# Patient Record
Sex: Female | Born: 1955 | Hispanic: No | Smoking: Never smoker
Health system: Southern US, Community
[De-identification: ages and names within clinical notes are randomized; demographics above are authoritative.]

## PROBLEM LIST (undated history)

## (undated) DIAGNOSIS — I639 Cerebral infarction, unspecified: Secondary | ICD-10-CM

## (undated) DIAGNOSIS — I10 Essential (primary) hypertension: Secondary | ICD-10-CM

## (undated) HISTORY — PX: HERNIA REPAIR: SHX51

## (undated) HISTORY — DX: Essential (primary) hypertension: I10

## (undated) HISTORY — DX: Cerebral infarction, unspecified: I63.9

---

## 1996-04-11 HISTORY — PX: OTHER SURGICAL HISTORY: SHX169

## 1999-05-19 ENCOUNTER — Other Ambulatory Visit: Admission: RE | Admit: 1999-05-19 | Discharge: 1999-05-19 | Payer: Self-pay | Admitting: Gynecology

## 2002-01-18 ENCOUNTER — Other Ambulatory Visit: Admission: RE | Admit: 2002-01-18 | Discharge: 2002-01-18 | Payer: Self-pay | Admitting: Gynecology

## 2005-06-14 ENCOUNTER — Other Ambulatory Visit: Admission: RE | Admit: 2005-06-14 | Discharge: 2005-06-14 | Payer: Self-pay | Admitting: Gynecology

## 2009-09-02 ENCOUNTER — Encounter: Admission: RE | Admit: 2009-09-02 | Discharge: 2009-09-02 | Payer: Self-pay | Admitting: Gynecology

## 2012-03-20 ENCOUNTER — Ambulatory Visit: Payer: Self-pay | Admitting: Family Medicine

## 2012-03-20 VITALS — BP 138/90 | HR 96 | Temp 98.5°F | Resp 16 | Ht 64.5 in | Wt 155.0 lb

## 2012-03-20 DIAGNOSIS — Z0289 Encounter for other administrative examinations: Secondary | ICD-10-CM

## 2012-03-20 DIAGNOSIS — R011 Cardiac murmur, unspecified: Secondary | ICD-10-CM

## 2012-03-20 NOTE — Patient Instructions (Addendum)
Your blood pressure was borderline high today.  Keep a record of your blood pressures outside of the office and bring them to the next office visit with a primary provider.  You also had a slight heart murmur today which can be followed up with a primary provider, or cardiologist in the next 6-8 weeks.  Return to the clinic or go to the nearest emergency room if any of your symptoms worsen or new symptoms occur.

## 2012-03-20 NOTE — Progress Notes (Signed)
Subjective:    Patient ID: Megan Rivera, female    DOB: 11-12-1955, 56 y.o.   MRN: [id number]  HPI Megan Rivera is a 56 y.o. 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  No primary care provider. Graduated recently form [employer]  Last td 2 years ago.    Tuberculosis Risk Questionnaire  1. Were you born outside the [place] in one of the following parts of the world:    [place], [place], [place], [place] or [place]?  no  2. Have you traveled outside the [place] and lived for more than one month in one of the following parts of the world:  [place], [place], [place], [place] or [place]?  no  3. Do you have a compromised immune system such as from any of the following conditions:  HIV/AIDS, organ or bone marrow transplantation, diabetes, immunosuppressive   medicines (e.g. Prednisone, Remicaide), leukemia, lymphoma, cancer of the   head or neck, gastrectomy or jejunal bypass, end-stage renal disease (on   dialysis), or silicosis?  No    4. Have you ever done one of the following:    Used crack cocaine, injected illegal drugs, worked or resided in jail or prison,   worked or resided at a homeless shelter, or worked as a Research scientist (physical sciences) in   direct contact with patients?  No  5. Have you ever been exposed to anyone with infectious tuberculosis?  No   Tuberculosis Symptom Questionnaire  Do you currently have any of the following symptoms?  1. Unexplained cough lasting more than 3 weeks? No  Unexplained fever lasting more than 3 weeks. No   3. Night Sweats (sweating that leaves the bedclothes and sheets wet)   No  4. Shortness of Breath No  5. Chest Pain No  6. Unintentional weight loss  No  7. Unexplained fatigue (very tired for no reason) No  Hx of migraine headache years ago.  Stressful day, no known hx of HTN.      Review of Systems  Constitutional: Negative for  fatigue and unexpected weight change.  Respiratory: Negative for chest tightness and shortness of breath.   Cardiovascular: Negative for chest pain, palpitations and leg swelling.  Gastrointestinal: Negative for abdominal pain and blood in stool.  Neurological: Negative for dizziness, syncope, light-headedness and headaches.       Objective:   Physical Exam  Constitutional: She is oriented to person, place, and time. She appears well-developed and well-nourished.  HENT:  Head: Normocephalic and atraumatic.  Right Ear: External ear normal.  Left Ear: External ear normal.  Mouth/Throat: Oropharynx is clear and moist.  Eyes: Conjunctivae normal are normal. Pupils are equal, round, and reactive to light.  Neck: Normal range of motion. Neck supple. No thyromegaly present.  Cardiovascular: Normal rate, regular rhythm and intact distal pulses.   Murmur heard.  Systolic murmur is present with a grade of 2/6       2/6 systolic murmur at sternal border.   Pulmonary/Chest: Effort normal and breath sounds normal. No respiratory distress. She has no wheezes.  Abdominal: Soft. Bowel sounds are normal. There is no tenderness.  Musculoskeletal: Normal range of motion. She exhibits no edema and no tenderness.  Lymphadenopathy:    She has no cervical adenopathy.  Neurological: She is alert and oriented to person, place, and time.  Skin: Skin is warm and dry. No rash noted.  Psychiatric: She has a normal mood  and affect. Her behavior is normal. Thought content normal.       Assessment & Plan:  Megan Rivera is a 56 y.o. female 1. Health examination of defined subpopulation   2. Heart murmur    School paperwork completed. See copy scanned. No restrictions, but discussed borderline HTN, and heart murmur needing follow up.  Asymptomatic currently. Rtc/er precautions.   Patient Instructions  Your blood pressure was borderline high today.  Keep a record of your blood pressures outside of the office  and bring them to the next office visit with a primary provider.  You also had a slight heart murmur today which can be followed up with a primary provider, or cardiologist in the next 6-8 weeks.  Return to the clinic or go to the nearest emergency room if any of your symptoms worsen or new symptoms occur.

## 2012-04-11 DIAGNOSIS — I639 Cerebral infarction, unspecified: Secondary | ICD-10-CM

## 2012-04-11 HISTORY — DX: Cerebral infarction, unspecified: I63.9

## 2012-07-12 ENCOUNTER — Emergency Department (HOSPITAL_COMMUNITY)
Admission: EM | Admit: 2012-07-12 | Discharge: 2012-07-12 | Disposition: A | Discharge status: Home / Self Care | Payer: Self-pay | Attending: Emergency Medicine | Admitting: Emergency Medicine

## 2012-07-12 ENCOUNTER — Encounter (HOSPITAL_COMMUNITY): Payer: Self-pay | Admitting: Nurse Practitioner

## 2012-07-12 ENCOUNTER — Emergency Department (HOSPITAL_COMMUNITY): Payer: Self-pay

## 2012-07-12 DIAGNOSIS — R109 Unspecified abdominal pain: Secondary | ICD-10-CM

## 2012-07-12 DIAGNOSIS — R112 Nausea with vomiting, unspecified: Secondary | ICD-10-CM | POA: Insufficient documentation

## 2012-07-12 DIAGNOSIS — Z3202 Encounter for pregnancy test, result negative: Secondary | ICD-10-CM | POA: Insufficient documentation

## 2012-07-12 DIAGNOSIS — R1013 Epigastric pain: Secondary | ICD-10-CM | POA: Insufficient documentation

## 2012-07-12 DIAGNOSIS — D649 Anemia, unspecified: Secondary | ICD-10-CM

## 2012-07-12 DIAGNOSIS — Z9889 Other specified postprocedural states: Secondary | ICD-10-CM | POA: Insufficient documentation

## 2012-07-12 DIAGNOSIS — Z8719 Personal history of other diseases of the digestive system: Secondary | ICD-10-CM | POA: Insufficient documentation

## 2012-07-12 LAB — HEPATIC FUNCTION PANEL
ALT: 17 U/L (ref 0–35)
AST: 18 U/L (ref 0–37)
Albumin: 3.5 g/dL (ref 3.5–5.2)
Total Bilirubin: 0.2 mg/dL — ABNORMAL LOW (ref 0.3–1.2)
Total Protein: 7 g/dL (ref 6.0–8.3)

## 2012-07-12 LAB — POCT I-STAT, CHEM 8
BUN: 12 mg/dL (ref 6–23)
Calcium, Ion: 1.23 mmol/L (ref 1.12–1.23)
Creatinine, Ser: 0.9 mg/dL (ref 0.50–1.10)
Glucose, Bld: 106 mg/dL — ABNORMAL HIGH (ref 70–99)
HCT: 33 % — ABNORMAL LOW (ref 36.0–46.0)
Sodium: 142 mEq/L (ref 135–145)

## 2012-07-12 LAB — CBC WITH DIFFERENTIAL/PLATELET
Basophils Absolute: 0.1 10*3/uL (ref 0.0–0.1)
Lymphs Abs: 1.4 10*3/uL (ref 0.7–4.0)
MCHC: 30 g/dL (ref 30.0–36.0)
MCV: 70.6 fL — ABNORMAL LOW (ref 78.0–100.0)
Monocytes Absolute: 0.8 10*3/uL (ref 0.1–1.0)
Monocytes Relative: 14 % — ABNORMAL HIGH (ref 3–12)
Neutro Abs: 2.9 10*3/uL (ref 1.7–7.7)
Platelets: 210 10*3/uL (ref 150–400)
RDW: 17.3 % — ABNORMAL HIGH (ref 11.5–15.5)
WBC: 5.4 10*3/uL (ref 4.0–10.5)

## 2012-07-12 LAB — URINALYSIS, ROUTINE W REFLEX MICROSCOPIC
Nitrite: NEGATIVE
Specific Gravity, Urine: 1.031 — ABNORMAL HIGH (ref 1.005–1.030)
Urobilinogen, UA: 0.2 mg/dL (ref 0.0–1.0)

## 2012-07-12 LAB — URINE MICROSCOPIC-ADD ON

## 2012-07-12 LAB — LIPASE, BLOOD: Lipase: 35 U/L (ref 11–59)

## 2012-07-12 MED ORDER — MORPHINE SULFATE 4 MG/ML IJ SOLN: 4.0000 mg | Freq: Once | INTRAMUSCULAR | Status: DC

## 2012-07-12 MED ORDER — GI COCKTAIL ~~LOC~~
30.0000 mL | Freq: Once | ORAL | Status: AC
  Administered 2012-07-12: 30 mL via ORAL
  Filled 2012-07-12: qty 30

## 2012-07-12 NOTE — ED Notes (Signed)
Per Patient:  Pt having intermittant sharp epigastric pain (under breasts that "rolls down to stomach") since Tuesday morning upon waking.  Lying propped up helps the pain.  Yesterday, pt felt nauseous and vomited x 1.  Hx of acid reflux per pt.

## 2012-07-12 NOTE — ED Notes (Signed)
Pt refused Morphine IV and IV.  She stated that she "wanted to treat this like a doctor's visit."

## 2012-07-12 NOTE — ED Notes (Signed)
Family at bedside. 

## 2012-07-12 NOTE — ED Notes (Signed)
Ultrasound at bedside for abdominal U/S.

## 2012-07-12 NOTE — ED Provider Notes (Signed)
History     CSN: [id number]  Arrival date & time 07/12/12  0716   First MD Initiated Contact with Patient 07/12/12 0720      No chief complaint on file.   (Consider location/radiation/quality/duration/timing/severity/associated sxs/prior treatment) HPI  57 year old female with history of hernia repair presents complaining of abdominal pain. Patient reports for the past 2-3 days she has had intermittent sharp epigastric pain. Pain usually lasting for a few seconds and, radiating across her abdomen, sometimes worsen when laying flat and often improve with activities. She felt nauseous and vomited 1 times. Vomitus is nonbloody, nonbilious. She did have history of acid reflux but states that this pain felt different. She has tried taking Zantac and Mylanta yesterday without any relief. She denies fever, chills, chest pain, shortness of breath, sore throat, cough, hemoptysis, hematemesis, dysuria, vaginal discharge, or rash. She is a nonsmoker with no significant family history of cardiac disease.  No past medical history on file.  Past Surgical History  Procedure Laterality Date  . Hernia repair      Family History  Problem Relation Age of Onset  . Hypertension Father   . Heart disease Father   . Cancer Maternal Aunt   . Cancer Paternal Uncle     History  Substance Use Topics  . Smoking status: Never Smoker   . Smokeless tobacco: Not on file  . Alcohol Use: Not on file    OB History   Grav Para Term Preterm Abortions TAB SAB Ect Mult Living                  Review of Systems  Constitutional:       10 Systems reviewed and all are negative for acute change except as noted in the HPI.     Allergies  Review of patient's allergies indicates no known allergies.  Home Medications  No current outpatient prescriptions on file.  There were no vitals taken for this visit.  Physical Exam  Nursing note and vitals reviewed. Constitutional: She is oriented to person, place,  and time. She appears well-developed and well-nourished. No distress.  Awake, alert, nontoxic appearance  HENT:  Head: Atraumatic.  Mouth/Throat: Oropharynx is clear and moist.  Eyes: Conjunctivae are normal. Right eye exhibits no discharge. Left eye exhibits no discharge.  Neck: Neck supple.  Cardiovascular: Normal rate and regular rhythm.  Exam reveals no gallop and no friction rub.   No murmur heard. Pulmonary/Chest: Effort normal. No respiratory distress. She exhibits no tenderness.  Abdominal: Soft. Bowel sounds are normal. She exhibits no distension. There is tenderness (Mild epigastric and mid abdomen tenderness on the patient without guarding or rebound tenderness. No hernia noted. No overlying skin changes.). There is no rebound and no guarding.  Genitourinary:  No CVA tenderness   Musculoskeletal: She exhibits no edema and no tenderness.  ROM appears intact, no obvious focal weakness  Neurological: She is alert and oriented to person, place, and time.  Mental status and motor strength appears intact  Skin: No rash noted.  Psychiatric: She has a normal mood and affect.    ED Course  Procedures (including critical care time)   Date: 07/12/2012  Rate: 71  Rhythm: normal sinus rhythm  QRS Axis: normal  Intervals: normal  ST/T Wave abnormalities: normal  Conduction Disutrbances: none  Narrative Interpretation: interpreted by me  Old EKG Reviewed: No prior ECG for comparison     7:44 AM Patient with intermittent sharp abdominal pain. Abdomen is nonsurgical. She has  a TIMI score 0, PERC negative.  Pt request for [place] to assess her gallbladder.  Will order.  Pain medication offered, pt decline.  9:29 AM Pt is currently pain free.  GI cocktail received, no changes.  Abd [place] without acute process.  Incidental finding of hyperechoic renal lesion was found, i discuss with pt the need to have repeat US in 12 months as recommended.  Pt also has evidence of anemia, Hgb 9.6.   Hemoccult negative.  Recommend pt to f/u with PCP for recheck.  Otherwise, she is stable for discharge.    Labs Reviewed  CBC WITH DIFFERENTIAL - Abnormal; Notable for the following:    Hemoglobin 9.6 (*)    HCT 32.0 (*)    MCV 70.6 (*)    MCH 21.2 (*)    RDW 17.3 (*)    Monocytes Relative 14 (*)    All other components within normal limits  URINALYSIS, ROUTINE W REFLEX MICROSCOPIC - Abnormal; Notable for the following:    APPearance CLOUDY (*)    Specific Gravity, Urine 1.031 (*)    Leukocytes, UA MODERATE (*)    All other components within normal limits  HEPATIC FUNCTION PANEL - Abnormal; Notable for the following:    Total Bilirubin 0.2 (*)    All other components within normal limits  URINE MICROSCOPIC-ADD ON - Abnormal; Notable for the following:    Squamous Epithelial / LPF MANY (*)    Bacteria, UA FEW (*)    All other components within normal limits  POCT I-STAT, CHEM 8 - Abnormal; Notable for the following:    Glucose, Bld 106 (*)    Hemoglobin 11.2 (*)    HCT 33.0 (*)    All other components within normal limits  URINE CULTURE  PREGNANCY, URINE  LIPASE, BLOOD  OCCULT BLOOD X 1 CARD TO LAB, STOOL  POCT I-STAT TROPONIN I  OCCULT BLOOD, POC DEVICE   US Abdomen Complete  07/12/2012  *RADIOLOGY REPORT*  Clinical Data:  Abdominal pain  COMPLETE ABDOMINAL ULTRASOUND  Comparison:  None.  Findings:        Gallbladder:  normal, without stone, wall thickening, or       pericholecystic fluid.  Sonographic Murphy's sign was not       elicited.              Common bile duct:  normal, at 4 mm        Liver:  normal in echogenicity without focal lesion.        IVC:  within normal limits.        Pancreas:  normal.        Spleen:  normal in size and echotexture.        Right Kidney:  11.0 cm.  No hydronephrosis. A a 1.6 cm inter/lower pole medial right renal cyst.        Left Kidney:  11.2 cm.  No hydronephrosis. A focus of apparent hyperechogenicity in the interpolar left kidney on image 7  measures 7 mm.  No shadowing.  Abdominal aorta:  Non aneurysmal without ascites.  IMPRESSION: 1. No acute process or explanation for abdominal pain. 2.  Suspicion of a 7 mm hyperechoic left renal lesion. 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  As a minority of hyperechoic renal lesions can represent solid neoplasms, follow-up with ultrasound at approximately six - 12 months is recommended.   Original Report Authenticated By: Jeronimo Greaves, M.D.      1. Abdominal pain  2. Anemia       MDM  BP 160/107  Temp(Src) 98.3 F (36.8 C) (Oral)  Resp 14  Ht 5\' 3"  (1.6 m)  SpO2 95%  LMP 06/21/2012  I have reviewed nursing notes and vital signs. I personally reviewed the imaging tests through PACS system  I reviewed available ER/hospitalization records thought the EMR         Fayrene Helper, [place] 07/12/12 4540

## 2012-07-13 LAB — URINE CULTURE

## 2012-07-13 NOTE — ED Provider Notes (Signed)
Medical screening examination/treatment/procedure(s) were performed by non-physician practitioner and as supervising physician I was immediately available for consultation/collaboration.    Celene Kras, MD 07/13/12 [phone]

## 2013-01-28 ENCOUNTER — Ambulatory Visit: Payer: Self-pay | Admitting: Advanced Practice Midwife

## 2013-02-05 ENCOUNTER — Ambulatory Visit: Payer: Self-pay | Attending: Internal Medicine

## 2013-02-14 ENCOUNTER — Ambulatory Visit: Payer: Self-pay | Admitting: Internal Medicine

## 2013-02-19 ENCOUNTER — Ambulatory Visit: Payer: Self-pay | Attending: Internal Medicine | Admitting: Internal Medicine

## 2013-02-19 VITALS — BP 171/115 | HR 80 | Temp 98.3°F | Resp 17 | Wt 168.2 lb

## 2013-02-19 DIAGNOSIS — I10 Essential (primary) hypertension: Secondary | ICD-10-CM | POA: Insufficient documentation

## 2013-02-19 DIAGNOSIS — Z0000 Encounter for general adult medical examination without abnormal findings: Secondary | ICD-10-CM | POA: Insufficient documentation

## 2013-02-19 DIAGNOSIS — I6350 Cerebral infarction due to unspecified occlusion or stenosis of unspecified cerebral artery: Secondary | ICD-10-CM

## 2013-02-19 DIAGNOSIS — Z23 Encounter for immunization: Secondary | ICD-10-CM

## 2013-02-19 DIAGNOSIS — I639 Cerebral infarction, unspecified: Secondary | ICD-10-CM | POA: Insufficient documentation

## 2013-02-19 DIAGNOSIS — K219 Gastro-esophageal reflux disease without esophagitis: Secondary | ICD-10-CM

## 2013-02-19 LAB — LIPID PANEL
HDL: 68 mg/dL (ref >39)
LDL Cholesterol: 117 mg/dL — ABNORMAL HIGH (ref 0–99)
Total CHOL/HDL Ratio: 3.2 Ratio
Triglycerides: 156 mg/dL — ABNORMAL HIGH (ref <150)
VLDL: 31 mg/dL (ref 0–40)

## 2013-02-19 LAB — BASIC METABOLIC PANEL
CO2: 25 mEq/L (ref 19–32)
Calcium: 9.4 mg/dL (ref 8.4–10.5)
Chloride: 106 mEq/L (ref 96–112)
Creat: 0.9 mg/dL (ref 0.50–1.10)
Glucose, Bld: 96 mg/dL (ref 70–99)
Potassium: 4.4 mEq/L (ref 3.5–5.3)

## 2013-02-19 LAB — CBC WITH DIFFERENTIAL/PLATELET
Basophils Absolute: 0 10*3/uL (ref 0.0–0.1)
Basophils Relative: 0 % (ref 0–1)
Eosinophils Absolute: 0.2 10*3/uL (ref 0.0–0.7)
Eosinophils Relative: 3 % (ref 0–5)
Monocytes Absolute: 0.8 10*3/uL (ref 0.1–1.0)
WBC: 5.7 10*3/uL (ref 4.0–10.5)

## 2013-02-19 MED ORDER — LISINOPRIL-HYDROCHLOROTHIAZIDE 20-25 MG PO TABS: 1.0000 | ORAL_TABLET | Freq: Every day | ORAL | Status: DC

## 2013-02-19 MED ORDER — PANTOPRAZOLE SODIUM 40 MG PO TBEC: 40.0000 mg | DELAYED_RELEASE_TABLET | Freq: Every day | ORAL | Status: DC

## 2013-02-19 NOTE — Progress Notes (Signed)
Patient here to establish care History of stroke

## 2013-02-19 NOTE — Patient Instructions (Signed)

## 2013-02-19 NOTE — Progress Notes (Signed)
Patient ID: Megan Rivera, female   DOB: 1955/04/28, 57 y.o.   MRN: [id number]  CC: New patient  HPI: 57 year old female with past medical history hypertension, CVA with no residual weakness presented to clinic to establish primary care physician. Patient feels good today with no complaints of chest pain, shortness of breath. She does have acid reflux somewhat controlled with Zantac.  No Known Allergies Past Medical History  Diagnosis Date  . Hypertension    Current Outpatient Prescriptions on File Prior to Visit  Medication Sig Dispense Refill  . ibuprofen (ADVIL,MOTRIN) 200 MG tablet Take 400 mg by mouth every 6 (six) hours as needed for pain.      . ranitidine (ZANTAC) 150 MG tablet Take 150 mg by mouth 2 (two) times daily.      . simethicone (MYLICON) 80 MG chewable tablet Chew 80 mg by mouth every 6 (six) hours as needed for flatulence.       No current facility-administered medications on file prior to visit.   Family History  Problem Relation Age of Onset  . Hypertension Father   . Heart disease Father   . Crohn's disease Father   . Cancer Maternal Aunt   . Cancer Paternal Uncle    History   Social History  . Marital Status: Unknown    Spouse Name: N/A    Number of Children: N/A  . Years of Education: N/A   Occupational History  . Not on file.   Social History Main Topics  . Smoking status: Never Smoker   . Smokeless tobacco: Never Used  . Alcohol Use: Not on file     Comment: Beer and wine on weekends  . Drug Use: No  . Sexual Activity: Yes   Other Topics Concern  . Not on file   Social History Narrative  . No narrative on file    Review of Systems  Constitutional: Negative for fever, chills, diaphoresis, activity change, appetite change and fatigue.  HENT: Negative for ear pain, nosebleeds, congestion, facial swelling, rhinorrhea, neck pain, neck stiffness and ear discharge.   Eyes: Negative for pain, discharge, redness, itching and visual disturbance.   Respiratory: Negative for cough, choking, chest tightness, shortness of breath, wheezing and stridor.   Cardiovascular: Negative for chest pain, palpitations and leg swelling.  Gastrointestinal: Negative for abdominal distention.  Genitourinary: Negative for dysuria, urgency, frequency, hematuria, flank pain, decreased urine volume, difficulty urinating and dyspareunia.  Musculoskeletal: Negative for back pain, joint swelling, arthralgias and gait problem.  Neurological: Negative for dizziness, tremors, seizures, syncope, facial asymmetry, speech difficulty, weakness, light-headedness, numbness and headaches.  Hematological: Negative for adenopathy. Does not bruise/bleed easily.  Psychiatric/Behavioral: Negative for hallucinations, behavioral problems, confusion, dysphoric mood, decreased concentration and agitation.    Objective:   Filed Vitals:   02/19/13 0941  BP: 171/115  Pulse:   Temp:   Resp:     Physical Exam  Constitutional: Appears well-developed and well-nourished. No distress.  HENT: Normocephalic. External right and left ear normal. Oropharynx is clear and moist.  Eyes: Conjunctivae and EOM are normal. PERRLA, no scleral icterus.  Neck: Normal ROM. Neck supple. No JVD. No tracheal deviation. No thyromegaly.  CVS: RRR, S1/S2 +, no murmurs, no gallops, no carotid bruit.  Pulmonary: Effort and breath sounds normal, no stridor, rhonchi, wheezes, rales.  Abdominal: Soft. BS +,  no distension, tenderness, rebound or guarding.  Musculoskeletal: Normal range of motion. No edema and no tenderness.  Lymphadenopathy: No lymphadenopathy noted, cervical, inguinal. Neuro: Alert. Normal  reflexes, muscle tone coordination. No cranial nerve deficit. Skin: Skin is warm and dry. No rash noted. Not diaphoretic. No erythema. No pallor.  Psychiatric: Normal mood and affect. Behavior, judgment, thought content normal.   Lab Results  Component Value Date   WBC 5.4 07/12/2012   HGB 11.2*  07/12/2012   HCT 33.0* 07/12/2012   MCV 70.6* 07/12/2012   PLT 210 07/12/2012   Lab Results  Component Value Date   CREATININE 0.90 07/12/2012   BUN 12 07/12/2012   NA 142 07/12/2012   K 3.9 07/12/2012   CL 107 07/12/2012    No results found for this basename: HGBA1C   Lipid Panel  No results found for this basename: chol, trig, hdl, cholhdl, vldl, ldlcalc       Assessment and plan:   Patient Active Problem List   Diagnosis Date Noted  . HTN (hypertension) 02/19/2013    Priority: High - We have discussed target BP range - I have advised pt to check BP regularly and to call us back if the numbers are higher than 140/90 - discussed the importance of compliance with medical therapy and diet  - Systolic blood pressure 171 this morning. Patient is only taking hydrochlorothiazide. We will discontinue this medication and start patient on Prinzide 20-25 mg daily. The patient was instructed to come back in one month so we can recheck blood pressure   . CVA (cerebral infarction) 02/19/2013    Priority: High - Stable with no residual weakness. Initial presentation was with left sided weakness and facial droop  - Referral to neurology provider   . Preventative health care 02/19/2013    Priority: High - GYN referral provided.  - Flu shot today  - A1c, TSH, lipid panel, BMP and CBC will be done today  - Also, we will change his Zantac to Protonix for better control of acid reflux

## 2013-02-23 ENCOUNTER — Encounter (HOSPITAL_COMMUNITY): Payer: Self-pay | Admitting: Emergency Medicine

## 2013-02-23 ENCOUNTER — Emergency Department (INDEPENDENT_AMBULATORY_CARE_PROVIDER_SITE_OTHER): Payer: Self-pay

## 2013-02-23 ENCOUNTER — Emergency Department (HOSPITAL_COMMUNITY)
Admission: EM | Admit: 2013-02-23 | Discharge: 2013-02-23 | Disposition: A | Discharge status: Home / Self Care | Payer: Self-pay | Source: Home / Self Care | Attending: Family Medicine | Admitting: Family Medicine

## 2013-02-23 DIAGNOSIS — S6990XA Unspecified injury of unspecified wrist, hand and finger(s), initial encounter: Secondary | ICD-10-CM

## 2013-02-23 DIAGNOSIS — S6992XA Unspecified injury of left wrist, hand and finger(s), initial encounter: Secondary | ICD-10-CM

## 2013-02-23 NOTE — ED Provider Notes (Signed)
CSN: [id number]     Arrival date & time 02/23/13  1800 History   First MD Initiated Contact with Patient 02/23/13 1831     Chief Complaint  Patient presents with  . Hand Injury   (Consider location/radiation/quality/duration/timing/severity/associated sxs/prior Treatment) HPI Patient is a 57 yo F presenting with left hand injury. Tripped over curb around 9:30 last night, fell on outstretched hand. Did not treat the injury last night. Woke up in pain this morning with progressively worsening swelling. Pain is worse in digits 3-4. Swelling in on dorsal surface of hand but does have a bruise on palm of hand. Denies wrist pain. No prior injury to hand before.   Past Medical History  Diagnosis Date  . Hypertension    Past Surgical History  Procedure Laterality Date  . Hernia repair    . Lump aspiration     Family History  Problem Relation Age of Onset  . Hypertension Father   . Heart disease Father   . Crohn's disease Father   . Cancer Maternal Aunt   . Cancer Paternal Uncle    History  Substance Use Topics  . Smoking status: Never Smoker   . Smokeless tobacco: Never Used  . Alcohol Use: Not on file     Comment: Beer and wine on weekends   OB History   Grav Para Term Preterm Abortions TAB SAB Ect Mult Living            1     Review of Systems  Constitutional: Negative for fever and chills.  HENT: Negative for congestion.   Eyes: Negative for visual disturbance.  Respiratory: Negative for cough and shortness of breath.   Cardiovascular: Negative for chest pain and leg swelling.  Gastrointestinal: Negative for abdominal pain.  Genitourinary: Negative for dysuria.  Musculoskeletal: Positive for arthralgias, joint swelling and myalgias.  Skin: Negative for rash and wound.  Neurological: Negative for headaches.    Allergies  Review of patient's allergies indicates no known allergies.  Home Medications   Current Outpatient Rx  Name  Route  Sig  Dispense  Refill  .  lisinopril-hydrochlorothiazide (PRINZIDE,ZESTORETIC) 20-25 MG per tablet   Oral   Take 1 tablet by mouth daily.   90 tablet   3   . pantoprazole (PROTONIX) 40 MG tablet   Oral   Take 1 tablet (40 mg total) by mouth daily.   30 tablet   3   . ibuprofen (ADVIL,MOTRIN) 200 MG tablet   Oral   Take 400 mg by mouth every 6 (six) hours as needed for pain.         . ranitidine (ZANTAC) 150 MG tablet   Oral   Take 150 mg by mouth 2 (two) times daily.         . simethicone (MYLICON) 80 MG chewable tablet   Oral   Chew 80 mg by mouth every 6 (six) hours as needed for flatulence.          BP 146/91  Pulse 100  Temp(Src) 98 F (36.7 C) (Oral)  Resp 16  SpO2 98%  LMP 06/21/2012 Physical Exam  Constitutional: She is oriented to person, place, and time. She appears well-developed and well-nourished. No distress.  HENT:  Head: Normocephalic and atraumatic.  Mouth/Throat: Oropharynx is clear and moist.  Eyes: Pupils are equal, round, and reactive to light.  Neck: Normal range of motion. Neck supple.  Cardiovascular: Normal rate, regular rhythm and normal heart sounds.   No murmur heard. Pulmonary/Chest:  Effort normal and breath sounds normal. She has no wheezes.  Abdominal: Soft. She exhibits no distension. There is no tenderness.  Musculoskeletal:       Left hand: She exhibits decreased range of motion (Decreased grip), bony tenderness (Of middle and ring fingers, most notable at MCP joints.) and swelling (Dorsal aspect of hand). She exhibits no deformity and no laceration. Normal sensation noted. Decreased strength noted. She exhibits finger abduction (Secondary to pain). She exhibits no wrist extension trouble.  Linear ecchymosis of ulnar side of palm  Neurological: She is alert and oriented to person, place, and time.  Skin: Skin is warm and dry.  Psychiatric: She has a normal mood and affect.    ED Course  Procedures (including critical care time) Labs Review Labs  Reviewed - No data to display Imaging Review Dg Wrist Complete Left  02/23/2013   CLINICAL DATA:  Fall  EXAM: LEFT WRIST - COMPLETE 3+ VIEW  COMPARISON:  None.  FINDINGS: No distal radius or ulnar fracture. Radiocarpal joint is intact. No carpal fracture. No soft tissue abnormality.  IMPRESSION: No fracture   Electronically Signed   By: Genevive Bi M.D.   On: 02/23/2013 19:32   Dg Hand Complete Left  02/23/2013   CLINICAL DATA:  Post fall, now with pain involving the 3rd and 4th digits and metacarpals  EXAM: LEFT HAND - COMPLETE 3+ VIEW  COMPARISON:  None.  FINDINGS: No fracture or dislocation with special attention paid to the 3rd and 4th metacarpals. Joint spaces are preserved. No erosions. Regional soft tissues are normal.  IMPRESSION: No acute findings.   Electronically Signed   By: Simonne Come M.D.   On: 02/23/2013 19:23    MDM   1. Hand injury, left, initial encounter    No signs of fracture. Only soft tissue injury from impact from fall. Offered buddy tape for 3rd and 4th fingers to reduce movement but patient declined.  Will use OTC pain medication. Ice, elevation and rest. F/u with PCP if pain worsens or if she has any concerns.    Hilarie Fredrickson, MD 02/23/13 2026

## 2013-02-23 NOTE — ED Notes (Signed)
C/o left hand injury, fell last night

## 2013-02-25 NOTE — ED Provider Notes (Signed)
Medical screening examination/treatment/procedure(s) were performed by a resident physician or non-physician practitioner and as the supervising physician I was immediately available for consultation/collaboration.  Burns Timson, MD    Veleka Djordjevic S Twisha Vanpelt, MD 02/25/13 0742 

## 2013-04-23 ENCOUNTER — Encounter: Payer: Self-pay | Admitting: Obstetrics & Gynecology

## 2013-04-26 ENCOUNTER — Encounter: Payer: Self-pay | Admitting: Neurology

## 2013-04-26 ENCOUNTER — Ambulatory Visit (INDEPENDENT_AMBULATORY_CARE_PROVIDER_SITE_OTHER): Payer: 59 | Admitting: Neurology

## 2013-04-26 ENCOUNTER — Encounter (INDEPENDENT_AMBULATORY_CARE_PROVIDER_SITE_OTHER): Payer: Self-pay

## 2013-04-26 ENCOUNTER — Telehealth: Payer: Self-pay | Admitting: Neurology

## 2013-04-26 VITALS — BP 135/96 | HR 97 | Ht 64.0 in | Wt 166.0 lb

## 2013-04-26 DIAGNOSIS — I619 Nontraumatic intracerebral hemorrhage, unspecified: Secondary | ICD-10-CM

## 2013-04-26 MED ORDER — AMLODIPINE BESYLATE 5 MG PO TABS: 5.0000 mg | ORAL_TABLET | Freq: Every day | ORAL | Status: DC

## 2013-04-26 NOTE — Telephone Encounter (Signed)
Patient saw Dr. Janann Colonel this morning and there are some items on her discharge instructions that she does not understand and would like a call back from the nurse. Please call.

## 2013-04-26 NOTE — Progress Notes (Signed)
[id number]    Provider:  Dr Janann Colonel Referring Provider: Robbie Lis, MD Primary Care Physician:  Kevan Ny, MD  CC:  CVA  HPI:  Megan Rivera is a 58 y.o. female here as a referral from Dr. Charlies Silvers for CVA follow up. Had a stroke in September, was on vacation in [place], was admitted to [facility], [facility], [facility]. She denies noticing any symptoms initially, eventually noted R. was told she had a left facial droop, trouble talking, gait instability with left-sided weakness. The symptoms slowly resolved over the next couple weeks. She states she was diagnosed with a hemorrhagic stroke, told her blood pressure was very high at the time of admission. Has not had repeat imaging since discharge. Has not done any physical therapy. States she is likely back to her normal baseline. Is currently taking lisinopril and hydrochlorothiazide per her primary care physician. Blood pressure hovers around 140 occasionally higher.   Has history of benign sexual headches, has happened multiple times in the past. Had a headache during the above event, initially thought it was due to the sexual headache but ended up being stroke related.   Review of Systems: Out of a complete 14 system review, the patient complains of only the following symptoms, and all other reviewed systems are negative. Positive headache insomnia anxiety  History   Social History  . Marital Status: Divorced    Spouse Name: N/A    Number of Children: 1  . Years of Education: Bachelor   Occupational History  . Not on file.   Social History Main Topics  . Smoking status: Never Smoker   . Smokeless tobacco: Never Used  . Alcohol Use: Yes     Comment: occasionally 3 drinks daily  . Drug Use: No  . Sexual Activity: Yes   Other Topics Concern  . Not on file   Social History Narrative   Patient lives at home with with boyfriend, has 1 child   Patient is right handed   Education level is Bachelor   Caffeine  consumption is 1 cup daily    Family History  Problem Relation Age of Onset  . Hypertension Father   . Heart disease Father   . Crohn's disease Father   . Cancer Maternal Aunt   . Cancer Paternal Uncle     Past Medical History  Diagnosis Date  . Hypertension     Past Surgical History  Procedure Laterality Date  . Hernia repair    . Lump aspiration      Current Outpatient Prescriptions  Medication Sig Dispense Refill  . ibuprofen (ADVIL,MOTRIN) 200 MG tablet Take 400 mg by mouth every 6 (six) hours as needed for pain.      Marland Kitchen lisinopril-hydrochlorothiazide (PRINZIDE,ZESTORETIC) 20-25 MG per tablet Take 1 tablet by mouth daily.  90 tablet  3  . pantoprazole (PROTONIX) 40 MG tablet Take 1 tablet (40 mg total) by mouth daily.  30 tablet  3   No current facility-administered medications for this visit.    Allergies as of 04/26/2013  . (No Known Allergies)    Vitals: BP 135/96  Pulse 97  Ht 5\' 4"  (1.626 m)  Wt 166 lb (75.297 kg)  BMI 28.48 kg/m2  LMP 06/21/2012 Last Weight:  Wt Readings from Last 1 Encounters:  04/26/13 166 lb (75.297 kg)   Last Height:   Ht Readings from Last 1 Encounters:  04/26/13 5\' 4"  (1.626 m)     Physical exam: Exam: Gen: NAD, conversant Eyes:  anicteric sclerae, moist conjunctivae HENT: Atraumatic, oropharynx clear Neck: Trachea midline; supple,  Lungs: CTA, no wheezing, rales, rhonic                          CV: RRR, no MRG Abdomen: Soft, non-tender;  Extremities: No peripheral edema  Skin: Normal temperature, no rash,  Psych: Appropriate affect, pleasant  Neuro: MS: AA&Ox3, appropriately interactive, normal affect   Speech: fluent w/o paraphasic error  Memory: good recent and remote recall  CN: PERRL, EOMI no nystagmus, no ptosis, sensation intact to LT V1-V3 bilat, face symmetric, no weakness, hearing grossly intact, palate elevates symmetrically, shoulder shrug 5/5 bilat,  tongue protrudes midline, no fasiculations  noted.  Motor: normal bulk and tone Strength: 5/5  In all extremities with minimal decreased ROM in distal LUE  Coord: rapid alternating and point-to-point (FNF, HTS) movements intact.  Reflexes: brisk left sided reflex, equivocal L plantar response, right downgoing  Sens: LT intact in all extremities  Gait: posture, stance, stride and arm-swing normal. Tandem gait intact. Able to walk on heels and toes. Romberg absent.   Assessment:  After physical and neurologic examination, review of laboratory studies, imaging, neurophysiology testing and pre-existing records, assessment will be reviewed on the problem list.  Plan:  Treatment plan and additional workup will be reviewed under Problem List.  1)Hemorrhagic infarct: suspect hypertensive etiology 2)HTN 3)Headache  58 year old woman with history of stroke in September 2014, which was hemorrhagic per the patient, presenting for initial evaluation after discharge. [employer] patient was on vacation in [place], told at that time her blood pressure was markedly elevated. Based on history suspect is like a hemorrhagic infarct. There has been no repeat imaging since discharge. Will repeat head CT. Will continue patient on hydrochlorothiazide and lisinopril, will add Norvasc 5 mg daily. If head CT is unremarkable would consider addition of daily aspirin. Patient counseled to followup in 6 months. Will obtain records from outside hospital.   Jim Like, DO  [facility] [address] [place], [place] [address]  Phone [phone] Fax [phone]

## 2013-04-26 NOTE — Telephone Encounter (Signed)
Spoke to patient and she just wanted to know if Dr. Janann Colonel was the one who scheduled her with Dr. Hulan Fray. I explained to her that that appointment was already scheduled prior to [place] and she understands.

## 2013-04-26 NOTE — Patient Instructions (Signed)
Overall you are doing fairly well but I do want to suggest a few things today:   Remember to drink plenty of fluid, eat healthy meals and do not skip any meals. Try to eat protein with a every meal and eat a healthy snack such as fruit or nuts in between meals. Try to keep a regular sleep-wake schedule and try to exercise daily, particularly in the form of walking, 20-30 minutes a day, if you can.   As far as your medications are concerned, I would like to start you on a medication called Norvasc. Start 5mg  daily.   As far as diagnostic testing I would like to check a follow up head CT. You will be called to schedule this  I would like to see you back in 6 months, sooner if we need to. Please call us with any interim questions, concerns, problems, updates or refill requests.   Please also call us for any test results so we can go over those with you on the phone.  My clinical assistant and will answer any of your questions and relay your messages to me and also relay most of my messages to you.   Our phone number is [phone]. We also have an after hours call service for urgent matters and there is a physician on-call for urgent questions. For any emergencies you know to call 911 or go to the nearest emergency room

## 2013-05-01 ENCOUNTER — Other Ambulatory Visit: Payer: Self-pay

## 2013-05-01 ENCOUNTER — Telehealth: Payer: Self-pay | Admitting: Neurology

## 2013-05-01 NOTE — Telephone Encounter (Signed)
[facility] called to let the doctor know that the patient was a no show today for her CT scan.

## 2013-05-02 ENCOUNTER — Ambulatory Visit
Admission: RE | Admit: 2013-05-02 | Discharge: 2013-05-02 | Disposition: A | Discharge status: Home / Self Care | Payer: 59 | Source: Ambulatory Visit | Attending: Neurology | Admitting: Neurology

## 2013-05-02 DIAGNOSIS — I619 Nontraumatic intracerebral hemorrhage, unspecified: Secondary | ICD-10-CM

## 2013-05-29 ENCOUNTER — Encounter: Payer: Self-pay | Admitting: Obstetrics & Gynecology

## 2013-06-04 ENCOUNTER — Ambulatory Visit: Payer: 59 | Attending: Internal Medicine

## 2013-06-04 ENCOUNTER — Telehealth: Payer: Self-pay | Admitting: Internal Medicine

## 2013-06-04 MED ORDER — ACYCLOVIR 5 % EX OINT: 1.0000 "application " | TOPICAL_OINTMENT | CUTANEOUS | Status: DC

## 2013-06-04 MED ORDER — ACYCLOVIR 400 MG PO TABS: 400.0000 mg | ORAL_TABLET | Freq: Every day | ORAL | Status: DC

## 2013-06-04 NOTE — Telephone Encounter (Signed)
Pt. Present to [facility] for a prescription for zovirax for cold sores.

## 2013-06-04 NOTE — Progress Notes (Unsigned)
Pt here with c/o cold sore to lower bottom lip that appeared this morning. Pt not using otc cream States zovirax oint works better vss

## 2013-07-16 ENCOUNTER — Ambulatory Visit (INDEPENDENT_AMBULATORY_CARE_PROVIDER_SITE_OTHER): Payer: 59 | Admitting: Advanced Practice Midwife

## 2013-07-16 ENCOUNTER — Encounter: Payer: Self-pay | Admitting: Advanced Practice Midwife

## 2013-07-16 VITALS — BP 135/88 | HR 82 | Temp 98.5°F | Ht 64.0 in | Wt 176.0 lb

## 2013-07-16 DIAGNOSIS — Z3202 Encounter for pregnancy test, result negative: Secondary | ICD-10-CM

## 2013-07-16 DIAGNOSIS — B009 Herpesviral infection, unspecified: Secondary | ICD-10-CM

## 2013-07-16 DIAGNOSIS — Z01419 Encounter for gynecological examination (general) (routine) without abnormal findings: Secondary | ICD-10-CM

## 2013-07-16 DIAGNOSIS — Z113 Encounter for screening for infections with a predominantly sexual mode of transmission: Secondary | ICD-10-CM

## 2013-07-16 LAB — POCT URINE PREGNANCY: PREG TEST UR: NEGATIVE

## 2013-07-16 MED ORDER — ACYCLOVIR 400 MG PO TABS: 400.0000 mg | ORAL_TABLET | Freq: Two times a day (BID) | ORAL | Status: DC

## 2013-07-16 NOTE — Progress Notes (Addendum)
Subjective:     Megan Rivera is a 58 y.o. female here for a routine exam.  Current complaints: Patient is in the office today for an Annual Exam. Patient states she thought she was post menopausal because she did not have any bleeding for a year until last month when she had 2 cycles in one month.     Patient very happy today, her daughter was accepted to a [place]. W/ full scholarship.  She is doing well. She had a bleeding stroke 12/2013 and is seeing a neurologist for this. She has not est care w/ a PCP.  Gynecologic History Patient's last menstrual period was 07/03/2012. Contraception: none Last Pap: 2010. Results were: normal Last mammogram: 2010. Results were: normal  Obstetric History OB History  Gravida Para Term Preterm AB SAB TAB Ectopic Multiple Living  1 1 1       1     # Outcome Date GA Lbr Len/2nd Weight Sex Delivery Anes PTL Lv  1 TRM 05/05/95 [redacted]w[redacted]d  7 lb 1 oz (3.204 kg) F SVD EPI  Y     The following portions of the patient's history were reviewed and updated as appropriate: allergies, current medications, past family history, past medical history, past social history, past surgical history and problem list.  Past Medical History  Diagnosis Date  . Hypertension   . Stroke    Family History  Problem Relation Age of Onset  . Hypertension Father   . Heart disease Father   . Crohn's disease Father   . Cancer Maternal Aunt   . Cancer Paternal Uncle      Review of Systems A comprehensive review of systems was negative except for: Genitourinary: positive for abnormal menstrual periods  Patient denies bloating, abdominal pain.    Objective:    BP 135/88  Pulse 82  Temp(Src) 98.5 F (36.9 C)  Ht 5\' 4"  (1.626 m)  Wt 176 lb (79.833 kg)  BMI 30.20 kg/m2  LMP 07/03/2012  General Appearance:    Alert, cooperative, no distress, appears stated age  Head:    Normocephalic, without obvious abnormality, atraumatic  Eyes:    PERRL, conjunctiva/corneas clear, EOM's  intact, fundi    benign, both eyes  Ears:    Normal TM's and external ear canals, both ears  Nose:   Nares normal, septum midline, mucosa normal, no drainage    or sinus tenderness  Throat:   Lips, mucosa, and tongue normal; teeth and gums normal  Neck:   Supple, symmetrical, trachea midline, no adenopathy;    thyroid:  no enlargement/tenderness/nodules; no carotid   bruit or JVD  Back:     Symmetric, no curvature, ROM normal, no CVA tenderness  Lungs:     Clear to auscultation bilaterally, respirations unlabored  Chest Wall:    No tenderness or deformity   Heart:    Regular rate and rhythm, S1 and S2 normal, no murmur, rub   or gallop  Breast Exam:    No tenderness, masses, or nipple abnormality  Abdomen:     Soft, non-tender, bowel sounds active all four quadrants,    no masses, no organomegaly  Genitalia:    Normal female without lesion, discharge or tenderness  Rectal:    Normal tone, normal prostate, no masses or tenderness;   guaiac negative stool  Extremities:   Extremities normal, atraumatic, no cyanosis or edema  Pulses:   2+ and symmetric all extremities  Skin:   Skin color, texture, turgor normal, no rashes or  lesions  Lymph nodes:   Cervical, supraclavicular, and axillary nodes normal  Neurologic:   CNII-XII intact, normal strength, sensation and reflexes    throughout       Assessment:    Healthy female exam.  Oral HSV History of recent Stroke, under care of neurologist Perimenopause Uterine bleeding recently 1 year after amenorrhea (06/2012) most likely attributed to recent stroke and medication.   Plan:    Education reviewed: calcium supplements, low fat, low cholesterol diet, safe sex/STD prevention, self breast exams and weight bearing exercise. Contraception: none. Mammogram ordered. Follow up in: 1 year. Mammogram ordered   Encouraged establishing care w/ PCP. Patient desires zovrax cream for HSV, this is not covered under her insurance, will take  Acyclovir; Rx sent to pharmacy. Cont to monitor uterine bleeding, patient to notify us if there is a change in status or symptoms change. MD Jodi Mourning agrees w/ plan of care. TSH, STD panel, Pap, GC/CT done today.  40 min spent with patient greater than 80% spent in counseling and coordination of care.   Jasaiah Karwowski Roni Bread CNM

## 2013-07-17 LAB — WET PREP BY MOLECULAR PROBE
Candida species: NEGATIVE
Gardnerella vaginalis: POSITIVE — AB
Trichomonas vaginosis: NEGATIVE

## 2013-07-17 LAB — GC/CHLAMYDIA PROBE AMP
CT PROBE, AMP APTIMA: NEGATIVE
GC Probe RNA: NEGATIVE

## 2013-07-17 LAB — TSH: TSH: 1.157 u[IU]/mL (ref 0.350–4.500)

## 2013-07-17 LAB — RPR

## 2013-07-17 LAB — HIV ANTIBODY (ROUTINE TESTING W REFLEX): HIV: NONREACTIVE

## 2013-07-17 LAB — HEPATITIS B SURFACE ANTIGEN: Hepatitis B Surface Ag: NEGATIVE

## 2013-07-17 LAB — HEPATITIS C ANTIBODY: HCV Ab: NEGATIVE

## 2013-07-18 LAB — PAP IG W/ RFLX HPV ASCU

## 2013-07-19 ENCOUNTER — Other Ambulatory Visit: Payer: Self-pay | Admitting: Advanced Practice Midwife

## 2013-07-19 ENCOUNTER — Ambulatory Visit (HOSPITAL_COMMUNITY)
Admission: RE | Admit: 2013-07-19 | Discharge: 2013-07-19 | Disposition: A | Discharge status: Home / Self Care | Payer: 59 | Source: Ambulatory Visit | Attending: Advanced Practice Midwife | Admitting: Advanced Practice Midwife

## 2013-07-19 DIAGNOSIS — Z1231 Encounter for screening mammogram for malignant neoplasm of breast: Secondary | ICD-10-CM

## 2013-07-19 DIAGNOSIS — Z0000 Encounter for general adult medical examination without abnormal findings: Secondary | ICD-10-CM

## 2013-07-31 ENCOUNTER — Other Ambulatory Visit: Payer: Self-pay | Admitting: Internal Medicine

## 2013-07-31 ENCOUNTER — Other Ambulatory Visit: Payer: Self-pay | Admitting: *Deleted

## 2013-07-31 MED ORDER — OMEPRAZOLE 20 MG PO CPDR: 20.0000 mg | DELAYED_RELEASE_CAPSULE | Freq: Every day | ORAL | Status: DC

## 2013-08-08 ENCOUNTER — Other Ambulatory Visit: Payer: Self-pay | Admitting: *Deleted

## 2013-08-08 DIAGNOSIS — N760 Acute vaginitis: Principal | ICD-10-CM

## 2013-08-08 DIAGNOSIS — B9689 Other specified bacterial agents as the cause of diseases classified elsewhere: Secondary | ICD-10-CM

## 2013-08-08 MED ORDER — METRONIDAZOLE 500 MG PO TABS: 500.0000 mg | ORAL_TABLET | Freq: Two times a day (BID) | ORAL | Status: DC

## 2013-10-19 ENCOUNTER — Other Ambulatory Visit: Payer: Self-pay | Admitting: Internal Medicine

## 2013-10-19 DIAGNOSIS — K219 Gastro-esophageal reflux disease without esophagitis: Secondary | ICD-10-CM

## 2013-11-04 ENCOUNTER — Ambulatory Visit: Payer: 59 | Admitting: Neurology

## 2013-12-02 ENCOUNTER — Ambulatory Visit: Payer: 59 | Admitting: Neurology

## 2014-01-04 ENCOUNTER — Other Ambulatory Visit: Payer: Self-pay | Admitting: Internal Medicine

## 2014-02-02 ENCOUNTER — Other Ambulatory Visit: Payer: Self-pay | Admitting: Internal Medicine

## 2014-02-10 ENCOUNTER — Encounter: Payer: Self-pay | Admitting: Advanced Practice Midwife

## 2014-02-16 ENCOUNTER — Emergency Department (HOSPITAL_COMMUNITY)
Admission: EM | Admit: 2014-02-16 | Discharge: 2014-02-16 | Disposition: A | Discharge status: Home / Self Care | Payer: 59 | Attending: Emergency Medicine | Admitting: Emergency Medicine

## 2014-02-16 ENCOUNTER — Encounter (HOSPITAL_COMMUNITY): Payer: Self-pay | Admitting: Emergency Medicine

## 2014-02-16 DIAGNOSIS — M79609 Pain in unspecified limb: Secondary | ICD-10-CM

## 2014-02-16 DIAGNOSIS — Z792 Long term (current) use of antibiotics: Secondary | ICD-10-CM | POA: Diagnosis not present

## 2014-02-16 DIAGNOSIS — Z79899 Other long term (current) drug therapy: Secondary | ICD-10-CM | POA: Diagnosis not present

## 2014-02-16 DIAGNOSIS — M79662 Pain in left lower leg: Secondary | ICD-10-CM | POA: Diagnosis not present

## 2014-02-16 DIAGNOSIS — Z8673 Personal history of transient ischemic attack (TIA), and cerebral infarction without residual deficits: Secondary | ICD-10-CM | POA: Diagnosis not present

## 2014-02-16 DIAGNOSIS — I10 Essential (primary) hypertension: Secondary | ICD-10-CM | POA: Insufficient documentation

## 2014-02-16 NOTE — Discharge Instructions (Signed)
Read the information below.  You may return to the Emergency Department at any time for worsening condition or any new symptoms that concern you.  If you develop uncontrolled pain, weakness or numbness of the extremity, severe discoloration of the skin, or you are unable to walk, return to the ER for a recheck.      Musculoskeletal Pain Musculoskeletal pain is muscle and boney aches and pains. These pains can occur in any part of the body. Your caregiver may treat you without knowing the cause of the pain. They may treat you if blood or urine tests, X-rays, and other tests were normal.  CAUSES There is often not a definite cause or reason for these pains. These pains may be caused by a type of germ (virus). The discomfort may also come from overuse. Overuse includes working out too hard when your body is not fit. Boney aches also come from weather changes. Bone is sensitive to atmospheric pressure changes. HOME CARE INSTRUCTIONS   Ask when your test results will be ready. Make sure you get your test results.  Only take over-the-counter or prescription medicines for pain, discomfort, or fever as directed by your caregiver. If you were given medications for your condition, do not drive, operate machinery or power tools, or sign legal documents for 24 hours. Do not drink alcohol. Do not take sleeping pills or other medications that may interfere with treatment.  Continue all activities unless the activities cause more pain. When the pain lessens, slowly resume normal activities. Gradually increase the intensity and duration of the activities or exercise.  During periods of severe pain, bed rest may be helpful. Lay or sit in any position that is comfortable.  Putting ice on the injured area.  Put ice in a bag.  Place a towel between your skin and the bag.  Leave the ice on for 15 to 20 minutes, 3 to 4 times a day.  Follow up with your caregiver for continued problems and no reason can be found  for the pain. If the pain becomes worse or does not go away, it may be necessary to repeat tests or do additional testing. Your caregiver may need to look further for a possible cause. SEEK IMMEDIATE MEDICAL CARE IF:  You have pain that is getting worse and is not relieved by medications.  You develop chest pain that is associated with shortness or breath, sweating, feeling sick to your stomach (nauseous), or throw up (vomit).  Your pain becomes localized to the abdomen.  You develop any new symptoms that seem different or that concern you. MAKE SURE YOU:   Understand these instructions.  Will watch your condition.  Will get help right away if you are not doing well or get worse. Document Released: 03/28/2005 Document Revised: 06/20/2011 Document Reviewed: 11/30/2012 [facility] Patient Information 2015 [place], [place]. This information is not intended to replace advice given to you by your health care provider. Make sure you discuss any questions you have with your health care provider.

## 2014-02-16 NOTE — Progress Notes (Signed)
VASCULAR LAB PRELIMINARY  PRELIMINARY  PRELIMINARY  PRELIMINARY  Left lower extremity venous duplex completed.    Preliminary report:  Left:  No evidence of DVT, superficial thrombosis, or Baker's cyst.  [name], RVT 02/16/2014, 1:08 PM

## 2014-02-16 NOTE — ED Provider Notes (Signed)
CSN: [id number]     Arrival date & time 02/16/14  1152 History   First MD Initiated Contact with Patient 02/16/14 1159     Chief Complaint  Patient presents with  . Leg Pain     (Consider location/radiation/quality/duration/timing/severity/associated sxs/prior Treatment) The history is provided by the patient.     Pt with hx HTN, stroke, presents with left calf pain and left posterior thigh pain.  States she developed a 10/10 intensity cramp in her left calf 3 days ago overnight that improved but has had residual 2/10 dull ache that has remained constant.  Last night pain began to radiate into her left posterior thigh.  She is on abx for dental abscess but otherwise has been feeling well.  Denies fevers, CP, SOB.  Denies injury to this leg.  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  No exogenous estrogen.  +Family hx blood clots.   Past Medical History  Diagnosis Date  . Hypertension   . Stroke    Past Surgical History  Procedure Laterality Date  . Hernia repair    . Lump aspiration     Family History  Problem Relation Age of Onset  . Hypertension Father   . Heart disease Father   . Crohn's disease Father   . Cancer Maternal Aunt   . Cancer Paternal Uncle    History  Substance Use Topics  . Smoking status: Never Smoker   . Smokeless tobacco: Never Used  . Alcohol Use: Yes     Comment: occasionally 3 drinks daily   OB History    Gravida Para Term Preterm AB TAB SAB Ectopic Multiple Living   1 1 1       1      Review of Systems  All other systems reviewed and are negative.     Allergies  Review of patient's allergies indicates no known allergies.  Home Medications   Prior to Admission medications   Medication Sig Start Date End Date Taking? Authorizing Provider  acyclovir (ZOVIRAX) 400 MG tablet Take 1 tablet (400 mg total) by mouth 5 (five) times daily. 06/04/13   Tresa Garter, MD  acyclovir (ZOVIRAX) 400 MG tablet Take 1 tablet (400  mg total) by mouth 2 (two) times daily. 07/16/13   Amy Thereasa Parkin, CNM  amLODipine (NORVASC) 5 MG tablet Take 1 tablet (5 mg total) by mouth daily. 04/26/13   Hulen Luster, DO  ibuprofen (ADVIL,MOTRIN) 200 MG tablet Take 400 mg by mouth every 6 (six) hours as needed for pain.    Historical Provider, MD  lisinopril-hydrochlorothiazide (PRINZIDE,ZESTORETIC) 20-25 MG per tablet Take 1 tablet by mouth daily. 02/19/13   Robbie Lis, MD  metroNIDAZOLE (FLAGYL) 500 MG tablet Take 1 tablet (500 mg total) by mouth 2 (two) times daily. 08/08/13   Amy Thereasa Parkin, CNM  omeprazole (PRILOSEC) 20 MG capsule TAKE ONE CAPSULE EVERY DAY 02/06/14   Tresa Garter, MD   BP 136/99 mmHg  Pulse 83  Temp(Src) 97.9 F (36.6 C) (Oral)  Resp 18  Ht 5\' 4"  (1.626 m)  Wt 150 lb (68.04 kg)  BMI 25.73 kg/m2  SpO2 99% Physical Exam  Constitutional: She appears well-developed and well-nourished. No distress.  HENT:  Head: Normocephalic and atraumatic.  Neck: Neck supple.  Cardiovascular: Normal rate, regular rhythm and intact distal pulses.   Pulmonary/Chest: Effort normal and breath sounds normal. No respiratory distress. She has no wheezes. She has no rales.  Musculoskeletal: Normal range of  motion. She exhibits no edema.  LLE:  Left calf tender.  No focal tenderness.  No masses.  No erythema, edema, warmth.  Sensation intact.  Distal pulses intact.  Foot is warm and well perfused. Full AROM.    Neurological: She is alert.  Skin: She is not diaphoretic.  Nursing note and vitals reviewed.   ED Course  Procedures (including critical care time) Labs Review Labs Reviewed - No data to display  Imaging Review No results found.   EKG Interpretation None       Venous duplex negative for blood clot.   1:36 PM Pt states she is so relieved she does not have a blood clot, states she does not want treatment for the pain.  She will use heat/ice, will drink extra water and take potassium at home.    MDM    Final diagnoses:  Calf pain, left    Afebrile, nontoxic patient with left calf pain x 4 days.  Started as spasm and now dull ache 1-2/10 intensity.  Family hx blood clots and pt works sedentary job.  No other known risk factors for blood clot.  No injury.  Neurovascularly intact. Venous duplex negative.   D/C home with PCP followup.  Discussed result, findings, treatment, and follow up  with patient.  Pt given return precautions.  Pt verbalizes understanding and agrees with plan.        Clayton Bibles, PA-C 02/16/14 [place], MD 02/21/14 [phone]

## 2014-02-16 NOTE — ED Notes (Signed)
Pt reports Thursday with severe pain in left calf. Pt reports pain now radiating up to left thigh. Pt feels a knot in her left calf. Pt with family history of blood clots. Pt history of HTN and stroke.

## 2014-03-04 ENCOUNTER — Other Ambulatory Visit: Payer: Self-pay | Admitting: Neurology

## 2014-03-04 NOTE — Telephone Encounter (Signed)
Patient di not come in for last 2 scheduled appts

## 2014-04-01 ENCOUNTER — Other Ambulatory Visit: Payer: Self-pay | Admitting: Neurology

## 2014-04-02 NOTE — Telephone Encounter (Signed)
Patient no showed last appt and cancelled the appt prior to that.  I spoke with her and she will call the office to reschedule

## 2014-04-03 NOTE — Telephone Encounter (Signed)
Auth temp supply via [facility] since Dr Janann Colonel left the practice so patient is not without medication over the holiday weekend.

## 2014-04-27 ENCOUNTER — Other Ambulatory Visit: Payer: Self-pay | Admitting: Internal Medicine

## 2014-06-05 ENCOUNTER — Other Ambulatory Visit: Payer: Self-pay | Admitting: Neurology

## 2014-06-10 ENCOUNTER — Other Ambulatory Visit: Payer: Self-pay | Admitting: Neurology

## 2014-06-10 NOTE — Telephone Encounter (Signed)
Duplicate request

## 2014-06-12 ENCOUNTER — Institutional Professional Consult (permissible substitution): Payer: 59 | Admitting: Neurology

## 2014-06-18 ENCOUNTER — Other Ambulatory Visit: Payer: Self-pay | Admitting: Neurology

## 2014-07-05 ENCOUNTER — Other Ambulatory Visit: Payer: Self-pay | Admitting: Internal Medicine

## 2014-07-13 ENCOUNTER — Other Ambulatory Visit: Payer: Self-pay | Admitting: Neurology

## 2014-07-22 ENCOUNTER — Telehealth: Payer: Self-pay | Admitting: Neurology

## 2014-07-22 NOTE — Telephone Encounter (Signed)
Pt is calling requesting a refill on amLODipine (NORVASC) 5 MG tablet she states she is completely out. Please call and advise.

## 2014-07-22 NOTE — Telephone Encounter (Signed)
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  Has not been seen in over 1 year.  Last appt from 03/03 says patient left without being seen.  I called the patient back.  Says she will check her calendar and call us back to schedule appt.

## 2014-07-24 MED ORDER — AMLODIPINE BESYLATE 5 MG PO TABS: ORAL_TABLET | ORAL | Status: DC

## 2014-07-24 NOTE — Telephone Encounter (Signed)
Rx was previously prescribed by Dr Janann Colonel.  Refill sent to last until appt.  I called back to advise. Got no answer.  Left message.

## 2014-07-24 NOTE — Telephone Encounter (Signed)
Patient has appt scheduled with Dr. Krista Blue 08/14/14 and questioning if she could get Rx amLODipine (NORVASC) 5 MG tablet refilled too last until appointment date.  Please call and advise.

## 2014-08-14 ENCOUNTER — Ambulatory Visit: Payer: 59 | Admitting: Neurology

## 2014-09-16 ENCOUNTER — Ambulatory Visit (INDEPENDENT_AMBULATORY_CARE_PROVIDER_SITE_OTHER): Payer: 59 | Admitting: Internal Medicine

## 2014-09-16 VITALS — BP 156/90 | HR 66 | Temp 98.5°F | Resp 16 | Ht 64.4 in | Wt 169.5 lb

## 2014-09-16 DIAGNOSIS — R011 Cardiac murmur, unspecified: Secondary | ICD-10-CM

## 2014-09-16 DIAGNOSIS — R010 Benign and innocent cardiac murmurs: Secondary | ICD-10-CM | POA: Diagnosis not present

## 2014-09-16 DIAGNOSIS — I619 Nontraumatic intracerebral hemorrhage, unspecified: Secondary | ICD-10-CM

## 2014-09-16 DIAGNOSIS — I10 Essential (primary) hypertension: Secondary | ICD-10-CM

## 2014-09-16 MED ORDER — AMLODIPINE BESYLATE 5 MG PO TABS: ORAL_TABLET | ORAL | Status: DC

## 2014-09-16 NOTE — Progress Notes (Addendum)
Subjective:    Patient ID: Megan Rivera, female    DOB: 1955/09/03, 59 y.o.   MRN: [id number] This chart was scribed for Tami Lin, MD by Marti Sleigh, Medical Scribe. This patient was seen in Room 12 and the patient's care was started a 2:36 PM.  Chief Complaint  Patient presents with  . Medication Refill    all of her BP medications  . needs referral to neurology    been over 1 yr since she has been seen.  will need to have referral placed    HPI HPI Comments: Megan Rivera is a 59 y.o. female with a past hx of hemorraghic CVA (two years ago) who presents to [facility] reporting for a medication refill. Pt states her BP is high today because she ran out of her BP medication yesterday. Pt states she was told her CVA was due to undetected high blood pressure.  Pt is also complaining of her toes intermittently want to curl on the left side, which is the same side that she had her CVA. Pt states she was cleared by the neurologist after her CVA two years ago. Pt states she has not had her cholesterol checked in multiple years.   She needs full evaluation for health maintenance issues except mammogram and Pap smear She was not happy with her last physician--06/04/2013--would like to follow-up here  Review of Systems  Constitutional: Negative for fever and chills.  Respiratory: Negative for shortness of breath.   Cardiovascular: Negative for chest pain and palpitations.  Neurological: Negative for dizziness and headaches.       Objective:   Physical Exam  Constitutional: She is oriented to person, place, and time. She appears well-developed and well-nourished. No distress.  HENT:  Head: Normocephalic and atraumatic.  Eyes: Conjunctivae and EOM are normal. Pupils are equal, round, and reactive to light.  Neck: Neck supple. No thyromegaly present.  Cardiovascular: Normal rate, regular rhythm and intact distal pulses.   Murmur heard. 2/6 systolic ejection murmur along the left  sternal border without radiation to the carotids  Pulmonary/Chest: Effort normal and breath sounds normal. No respiratory distress.  Musculoskeletal: Normal range of motion. She exhibits no edema.  Lymphadenopathy:    She has no cervical adenopathy.  Neurological: She is alert and oriented to person, place, and time. She has normal reflexes. No cranial nerve deficit. She exhibits normal muscle tone. Coordination normal.  Skin: Skin is warm and dry. She is not diaphoretic.  Psychiatric: She has a normal mood and affect. Her behavior is normal.  Nursing note and vitals reviewed.  BP 156/90 mmHg  Pulse 66  Temp(Src) 98.5 F (36.9 C) (Oral)  Resp 16  Ht 5' 4.4" (1.636 m)  Wt 169 lb 8 oz (76.885 kg)  BMI 28.73 kg/m2  SpO2 97%     Assessment & Plan:  Essential hypertension--off medications/erratic follow-up  Flow murmur--- she has a history of this for many years although it is never been investigated by echo  Nontraumatic intracerebral hemorrhage, unspecified cerebral location--no sequelae   She wanted to defer blood work until she follows up for her health maintenance exam in 3 months She agrees to defer neurology follow-ups and she has a normal exam and this will be rechecked at her follow-up  Meds ordered this encounter  Medications  . amLODipine (NORVASC) 5 MG tablet    Sig: TAKE 1 TABLET (5 MG TOTAL) BY MOUTH DAILY.    Dispense:  90 tablet    Refill:  0   She will check home blood pressures and call if not below 130/80 in the next 3 weeks  I have completed the patient encounter in its entirety as documented by the scribe, with editing by me where necessary. Courtnay Petrilla P. Laney Pastor, M.D.

## 2014-09-17 ENCOUNTER — Other Ambulatory Visit: Payer: Self-pay | Admitting: Internal Medicine

## 2014-09-17 ENCOUNTER — Other Ambulatory Visit: Payer: Self-pay | Admitting: Neurology

## 2014-09-17 NOTE — Progress Notes (Signed)
LMOM to give [place] a call back about an appt with any provider she needs a follow-up for CPE before she runs out of her 3 month supply of medication with anyone who is taking new patients at [facility]

## 2014-10-07 ENCOUNTER — Other Ambulatory Visit: Payer: Self-pay | Admitting: Internal Medicine

## 2014-10-20 ENCOUNTER — Encounter: Payer: 59 | Admitting: Physician Assistant

## 2014-10-21 ENCOUNTER — Encounter: Payer: Self-pay | Admitting: Gastroenterology

## 2014-10-21 ENCOUNTER — Ambulatory Visit (INDEPENDENT_AMBULATORY_CARE_PROVIDER_SITE_OTHER): Payer: 59 | Admitting: Family Medicine

## 2014-10-21 VITALS — BP 128/88 | HR 72 | Temp 98.8°F | Resp 16 | Ht 64.5 in | Wt 168.0 lb

## 2014-10-21 DIAGNOSIS — I639 Cerebral infarction, unspecified: Secondary | ICD-10-CM | POA: Diagnosis not present

## 2014-10-21 DIAGNOSIS — Z1211 Encounter for screening for malignant neoplasm of colon: Secondary | ICD-10-CM

## 2014-10-21 DIAGNOSIS — Z1231 Encounter for screening mammogram for malignant neoplasm of breast: Secondary | ICD-10-CM | POA: Diagnosis not present

## 2014-10-21 DIAGNOSIS — Z0000 Encounter for general adult medical examination without abnormal findings: Secondary | ICD-10-CM

## 2014-10-21 DIAGNOSIS — K219 Gastro-esophageal reflux disease without esophagitis: Secondary | ICD-10-CM

## 2014-10-21 DIAGNOSIS — I10 Essential (primary) hypertension: Secondary | ICD-10-CM

## 2014-10-21 MED ORDER — OMEPRAZOLE 10 MG PO CPDR: 10.0000 mg | DELAYED_RELEASE_CAPSULE | Freq: Every day | ORAL | Status: DC

## 2014-10-21 NOTE — Progress Notes (Signed)
Subjective:    Patient ID: Megan Rivera, female    DOB: [birth year], 57 y.o.   MRN: [id number]  HPI The patient presents today for CPE. She has a history of HTN, CVA. LMP 3/15. 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   Last CPE- 12/14 Mammo- 07/2013 Pap- 4/15 Colonoscopy- interested in screening Tdap- less than 10 years (approx 3 years ago) Flu- last year Eye- regular Dental-regular Exercise- physical job  Has noticed that she has daily curling of her toes on her left foot and has noticed some weakness with her left arm.  Would like referral to neuro for post CVA care.   Review of Systems No chest pain, no SOB, no palpitation, no visual changes, no headaches, no dizziness,no diarrhea, no constipation, no abdominal pain, some low back soreness occasionally. Sleeps well, no anxiety or depression.     Objective:   Physical Exam Physical Exam  Constitutional: She is oriented to person, place, and time. She appears well-developed and well-nourished. No distress.  HENT:  Head: Normocephalic and atraumatic.  Right Ear: External ear normal.  Left Ear: External ear normal.  Nose: Nose normal.  Mouth/Throat: Oropharynx is clear and moist. No oropharyngeal exudate.  Eyes: Conjunctivae are normal. Pupils are equal, round, and reactive to light.  Neck: Normal range of motion. Neck supple. No JVD present. No thyromegaly present.  Cardiovascular: Normal rate, regular rhythm, normal heart sounds and intact distal pulses.   Pulmonary/Chest: Effort normal and breath sounds normal. Right breast exhibits no inverted nipple, no mass, no nipple discharge, no skin change and no tenderness. Left breast exhibits no inverted nipple, no mass, no nipple discharge, no skin change and no tenderness. Breasts are symmetrical.  Abdominal: Soft. Bowel sounds are normal. She exhibits no distension and no mass. There is no tenderness. There is no rebound and no guarding.  Genitourinary: Vagina  normal. Pelvic exam was performed with patient supine. There is no rash, tenderness, lesion or injury on the right labia. There is no rash, tenderness, lesion or injury on the left labia. No vaginal discharge found.  Musculoskeletal: Normal range of motion. She exhibits no edema or tenderness. Strength 5/5 throughout. DTRs +2. Lymphadenopathy:    She has no cervical adenopathy.  Neurological: She is alert and oriented to person, place, and time. She has normal reflexes.  Skin: Skin is warm and dry. She is not diaphoretic.  Psychiatric: She has a normal mood and affect. Her behavior is normal. Judgment and thought content normal.  Vitals reviewed.  BP 128/88 mmHg  Pulse 72  Temp(Src) 98.8 F (37.1 C) (Oral)  Resp 16  Ht 5' 4.5" (1.638 m)  Wt 168 lb (76.204 kg)  BMI 28.40 kg/m2  SpO2 97% Wt Readings from Last 3 Encounters:  10/21/14 168 lb (76.204 kg)  09/16/14 169 lb 8 oz (76.885 kg)  02/16/14 150 lb (68.04 kg)      Assessment & Plan:  1. Annual physical exam  2. Essential hypertension - CBC - Comprehensive metabolic panel - Lipid panel - TSH  3. Gastroesophageal reflux disease, esophagitis presence not specified - omeprazole (PRILOSEC) 10 MG capsule; Take 1 capsule (10 mg total) by mouth daily.  Dispense: 30 capsule; Refill: 2 - patient has been on PPI for about 2 years. Discussed weaning off PPI and provided instructions.  4. Cerebral infarction due to unspecified mechanism - Ambulatory referral to Neurology  5. Colon cancer screening - Ambulatory referral to Gastroenterology  6. Encounter for screening mammogram  for breast cancer - MM Digital Screening; Future  - follow up in 3 months Clarene Reamer, FNP-BC  [facility] [employer]  10/21/2014 3:42 PM

## 2014-10-21 NOTE — Patient Instructions (Signed)
You are currently on a low dose of omeprazole. You can try to wean off by taking every other day. On alternating days, you can take ranitidine or famotidine over the counter. After 1-2 weeks, you can take every three days for 2 weeks then stop.  Gastroesophageal Reflux Disease, Adult Gastroesophageal reflux disease (GERD) happens when acid from your stomach flows up into the esophagus. When acid comes in contact with the esophagus, the acid causes soreness (inflammation) in the esophagus. Over time, GERD may create small holes (ulcers) in the lining of the esophagus. CAUSES   Increased body weight. This puts pressure on the stomach, making acid rise from the stomach into the esophagus.  Smoking. This increases acid production in the stomach.  Drinking alcohol. This causes decreased pressure in the lower esophageal sphincter (valve or ring of muscle between the esophagus and stomach), allowing acid from the stomach into the esophagus.  Late evening meals and a full stomach. This increases pressure and acid production in the stomach.  A malformed lower esophageal sphincter. Sometimes, no cause is found. SYMPTOMS   Burning pain in the lower part of the mid-chest behind the breastbone and in the mid-stomach area. This may occur twice a week or more often.  Trouble swallowing.  Sore throat.  Dry cough.  Asthma-like symptoms including chest tightness, shortness of breath, or wheezing. DIAGNOSIS  Your caregiver may be able to diagnose GERD based on your symptoms. In some cases, X-rays and other tests may be done to check for complications or to check the condition of your stomach and esophagus. TREATMENT  Your caregiver may recommend over-the-counter or prescription medicines to help decrease acid production. Ask your caregiver before starting or adding any new medicines.  HOME CARE INSTRUCTIONS   Change the factors that you can control. Ask your caregiver for guidance concerning weight  loss, quitting smoking, and alcohol consumption.  Avoid foods and drinks that make your symptoms worse, such as:  Caffeine or alcoholic drinks.  Chocolate.  Peppermint or mint flavorings.  Garlic and onions.  Spicy foods.  Citrus fruits, such as oranges, lemons, or limes.  Tomato-based foods such as sauce, chili, salsa, and pizza.  Fried and fatty foods.  Avoid lying down for the 3 hours prior to your bedtime or prior to taking a nap.  Eat small, frequent meals instead of large meals.  Wear loose-fitting clothing. Do not wear anything tight around your waist that causes pressure on your stomach.  Raise the head of your bed 6 to 8 inches with wood blocks to help you sleep. Extra pillows will not help.  Only take over-the-counter or prescription medicines for pain, discomfort, or fever as directed by your caregiver.  Do not take aspirin, ibuprofen, or other nonsteroidal anti-inflammatory drugs (NSAIDs). SEEK IMMEDIATE MEDICAL CARE IF:   You have pain in your arms, neck, jaw, teeth, or back.  Your pain increases or changes in intensity or duration.  You develop nausea, vomiting, or sweating (diaphoresis).  You develop shortness of breath, or you faint.  Your vomit is green, yellow, black, or looks like coffee grounds or blood.  Your stool is red, bloody, or black. These symptoms could be signs of other problems, such as heart disease, gastric bleeding, or esophageal bleeding. MAKE SURE YOU:   Understand these instructions.  Will watch your condition.  Will get help right away if you are not doing well or get worse. Document Released: 01/05/2005 Document Revised: 06/20/2011 Document Reviewed: 10/15/2010 ExitCare Patient Information 2015  [employer]. This information is not intended to replace advice given to you by your health care provider. Make sure you discuss any questions you have with your health care provider.

## 2014-10-22 ENCOUNTER — Encounter: Payer: Self-pay | Admitting: Family Medicine

## 2014-10-22 LAB — COMPREHENSIVE METABOLIC PANEL
ALBUMIN: 4 g/dL (ref 3.5–5.2)
ALK PHOS: 89 U/L (ref 39–117)
ALT: 24 U/L (ref 0–35)
AST: 18 U/L (ref 0–37)
BUN: 13 mg/dL (ref 6–23)
CHLORIDE: 104 meq/L (ref 96–112)
CO2: 27 meq/L (ref 19–32)
Calcium: 9.7 mg/dL (ref 8.4–10.5)
Creat: 0.77 mg/dL (ref 0.50–1.10)
GLUCOSE: 96 mg/dL (ref 70–99)
Potassium: 3.9 mEq/L (ref 3.5–5.3)
Sodium: 141 mEq/L (ref 135–145)
Total Bilirubin: 0.5 mg/dL (ref 0.2–1.2)
Total Protein: 7 g/dL (ref 6.0–8.3)

## 2014-10-22 LAB — LIPID PANEL
CHOL/HDL RATIO: 3.8 ratio
CHOLESTEROL: 226 mg/dL — AB (ref 0–200)
HDL: 60 mg/dL (ref >=46)
LDL CALC: 137 mg/dL — AB (ref 0–99)
Triglycerides: 144 mg/dL (ref <150)
VLDL: 29 mg/dL (ref 0–40)

## 2014-10-22 LAB — CBC
HEMATOCRIT: 43.3 % (ref 36.0–46.0)
HEMOGLOBIN: 14.5 g/dL (ref 12.0–15.0)
MCH: 29.3 pg (ref 26.0–34.0)
MCHC: 33.5 g/dL (ref 30.0–36.0)
MCV: 87.5 fL (ref 78.0–100.0)
MPV: 11.3 fL (ref 8.6–12.4)
PLATELETS: 194 10*3/uL (ref 150–400)
RBC: 4.95 MIL/uL (ref 3.87–5.11)
RDW: 15.8 % — AB (ref 11.5–15.5)
WBC: 6.5 10*3/uL (ref 4.0–10.5)

## 2014-10-22 LAB — TSH: TSH: 1.345 u[IU]/mL (ref 0.350–4.500)

## 2014-10-24 ENCOUNTER — Other Ambulatory Visit: Payer: Self-pay

## 2014-10-24 DIAGNOSIS — Z1231 Encounter for screening mammogram for malignant neoplasm of breast: Secondary | ICD-10-CM

## 2014-11-04 ENCOUNTER — Ambulatory Visit
Admission: RE | Admit: 2014-11-04 | Discharge: 2014-11-04 | Disposition: A | Discharge status: Home / Self Care | Payer: 59 | Source: Ambulatory Visit | Attending: Family Medicine | Admitting: Family Medicine

## 2014-11-04 ENCOUNTER — Ambulatory Visit: Payer: 59

## 2014-11-04 DIAGNOSIS — Z1231 Encounter for screening mammogram for malignant neoplasm of breast: Secondary | ICD-10-CM

## 2014-12-23 ENCOUNTER — Other Ambulatory Visit: Payer: Self-pay | Admitting: Internal Medicine

## 2014-12-25 ENCOUNTER — Encounter: Payer: Self-pay | Admitting: Gastroenterology

## 2014-12-30 ENCOUNTER — Encounter: Payer: 59 | Admitting: Gastroenterology

## 2015-02-04 ENCOUNTER — Ambulatory Visit (AMBULATORY_SURGERY_CENTER): Payer: Self-pay

## 2015-02-04 VITALS — Ht 64.0 in | Wt 168.6 lb

## 2015-02-04 DIAGNOSIS — Z800 Family history of malignant neoplasm of digestive organs: Secondary | ICD-10-CM

## 2015-02-04 MED ORDER — SUPREP BOWEL PREP KIT 17.5-3.13-1.6 GM/177ML PO SOLN: 1.0000 | Freq: Once | ORAL | Status: DC

## 2015-02-04 NOTE — Progress Notes (Signed)
No allergies to eggs or soy No past problems with anesthesia No diet/weight loss meds No home oxygen  Has internet; refused emmi

## 2015-02-17 ENCOUNTER — Encounter: Payer: Self-pay | Admitting: Gastroenterology

## 2015-02-17 ENCOUNTER — Ambulatory Visit (AMBULATORY_SURGERY_CENTER): Payer: 59 | Admitting: Gastroenterology

## 2015-02-17 VITALS — BP 140/90 | HR 57 | Temp 97.7°F | Resp 16 | Ht 64.0 in | Wt 168.0 lb

## 2015-02-17 DIAGNOSIS — D123 Benign neoplasm of transverse colon: Secondary | ICD-10-CM | POA: Diagnosis not present

## 2015-02-17 DIAGNOSIS — Z1211 Encounter for screening for malignant neoplasm of colon: Secondary | ICD-10-CM

## 2015-02-17 DIAGNOSIS — Z800 Family history of malignant neoplasm of digestive organs: Secondary | ICD-10-CM

## 2015-02-17 MED ORDER — SODIUM CHLORIDE 0.9 % IV SOLN: 500.0000 mL | INTRAVENOUS | Status: DC

## 2015-02-17 NOTE — Progress Notes (Signed)
A/ox3 pleased with MAC, report to Sheila RN 

## 2015-02-17 NOTE — Progress Notes (Signed)
Called to room to assist during endoscopic procedure.  Patient ID and intended procedure confirmed with present staff. Received instructions for my participation in the procedure from the performing physician.  

## 2015-02-17 NOTE — Patient Instructions (Signed)
YOU HAD AN ENDOSCOPIC PROCEDURE TODAY AT THE [facility]:   Refer to the procedure report that was given to you for any specific questions about what was found during the examination.  If the procedure report does not answer your questions, please call your gastroenterologist to clarify.  If you requested that your care partner not be given the details of your procedure findings, then the procedure report has been included in a sealed envelope for you to review at your convenience later.  YOU SHOULD EXPECT: Some feelings of bloating in the abdomen. Passage of more gas than usual.  Walking can help get rid of the air that was put into your GI tract during the procedure and reduce the bloating. If you had a lower endoscopy (such as a colonoscopy or flexible sigmoidoscopy) you may notice spotting of blood in your stool or on the toilet paper. If you underwent a bowel prep for your procedure, you may not have a normal bowel movement for a few days.  Please Note:  You might notice some irritation and congestion in your nose or some drainage.  This is from the oxygen used during your procedure.  There is no need for concern and it should clear up in a day or so.  SYMPTOMS TO REPORT IMMEDIATELY:   Following lower endoscopy (colonoscopy or flexible sigmoidoscopy):  Excessive amounts of blood in the stool  Significant tenderness or worsening of abdominal pains  Swelling of the abdomen that is new, acute  Fever of 100F or higher   For urgent or emergent issues, a gastroenterologist can be reached at any hour by calling [phone].   DIET: Your first meal following the procedure should be a small meal and then it is ok to progress to your normal diet. Heavy or fried foods are harder to digest and may make you feel nauseous or bloated.  Likewise, meals heavy in dairy and vegetables can increase bloating.  Drink plenty of fluids but you should avoid alcoholic beverages for 24  hours.  ACTIVITY:  You should plan to take it easy for the rest of today and you should NOT DRIVE or use heavy machinery until tomorrow (because of the sedation medicines used during the test).    FOLLOW UP: Our staff will call the number listed on your records the next business day following your procedure to check on you and address any questions or concerns that you may have regarding the information given to you following your procedure. If we do not reach you, we will leave a message.  However, if you are feeling well and you are not experiencing any problems, there is no need to return our call.  We will assume that you have returned to your regular daily activities without incident.  If any biopsies were taken you will be contacted by phone or by letter within the next 1-3 weeks.  Please call us at [phone] if you have not heard about the biopsies in 3 weeks.    SIGNATURES/CONFIDENTIALITY: You and/or your care partner have signed paperwork which will be entered into your electronic medical record.  These signatures attest to the fact that that the information above on your After Visit Summary has been reviewed and is understood.  Full responsibility of the confidentiality of this discharge information lies with you and/or your care-partner.    Resume medications. Information given on polyps. 

## 2015-02-17 NOTE — Op Note (Signed)
[place]  [address] [place], [id number]   COLONOSCOPY PROCEDURE REPORT  PATIENT: Infant, Doane  MR#: [id number] BIRTHDATE: 1955/05/21 , 57  yrs. old GENDER: female ENDOSCOPIST: Milus Banister, MD REFERRED GL:OVFIEP Laney Pastor, M.D. PROCEDURE DATE:  02/17/2015 PROCEDURE:   Colonoscopy, screening and Colonoscopy with biopsy First Screening Colonoscopy - Avg.  risk and is 50 yrs.  old or older Yes.  Prior Negative Screening - Now for repeat screening. N/A  History of Adenoma - Now for follow-up colonoscopy & has been > or = to 3 yrs.  N/A  Polyps removed today? Yes ASA CLASS:   Class II INDICATIONS:Screening for colonic neoplasia and Colorectal Neoplasm Risk Assessment for this procedure is average risk. MEDICATIONS: Monitored anesthesia care and Propofol 250 mg IV  DESCRIPTION OF PROCEDURE:   After the risks benefits and alternatives of the procedure were thoroughly explained, informed consent was obtained.  The digital rectal exam revealed no abnormalities of the rectum.   The LB CF-[id number] Loaner [id number] endoscope was introduced through the anus and advanced to the cecum, which was identified by both the appendix and ileocecal valve. No adverse events experienced.   The quality of the prep was excellent.  The instrument was then slowly withdrawn as the colon was fully examined. Estimated blood loss is zero unless otherwise noted in this procedure report.   COLON FINDINGS: A sessile polyp measuring 2 mm in size was found in the transverse colon.  A polypectomy was performed with cold forceps.  The resection was complete, the polyp tissue was completely retrieved and sent to histology.   The examination was otherwise normal.  Retroflexed views revealed no abnormalities. The time to cecum = 2.7 Withdrawal time = 10.8   The scope was withdrawn and the procedure completed. COMPLICATIONS: There were no immediate complications.  ENDOSCOPIC IMPRESSION: 1.    Sessile polyp was found in the transverse colon; polypectomy was performed with cold forceps 2.   The examination was otherwise normal  RECOMMENDATIONS: If the polyp(s) removed today are proven to be adenomatous (pre-cancerous) polyps, you will need a repeat colonoscopy in 5 years.  Otherwise you should continue to follow colorectal cancer screening guidelines for "routine risk" patients with colonoscopy in 10 years.  You will receive a letter within 1-2 weeks with the results of your biopsy as well as final recommendations.  Please call my office if you have not received a letter after 3 weeks.  eSigned:  Milus Banister, MD 02/17/2015 2:33 PM

## 2015-02-18 ENCOUNTER — Telehealth: Payer: Self-pay | Admitting: *Deleted

## 2015-02-18 NOTE — Telephone Encounter (Signed)
Left message that we called for f/u 

## 2015-02-25 ENCOUNTER — Encounter: Payer: Self-pay | Admitting: Gastroenterology

## 2015-03-09 ENCOUNTER — Encounter: Payer: Self-pay | Admitting: Internal Medicine

## 2015-05-07 ENCOUNTER — Other Ambulatory Visit: Payer: Self-pay | Admitting: Family Medicine

## 2015-06-09 ENCOUNTER — Ambulatory Visit: Payer: Self-pay | Admitting: Family Medicine

## 2015-06-12 ENCOUNTER — Other Ambulatory Visit: Payer: Self-pay | Admitting: Internal Medicine

## 2015-06-12 NOTE — Telephone Encounter (Signed)
Needs office visit.

## 2015-10-14 ENCOUNTER — Ambulatory Visit (INDEPENDENT_AMBULATORY_CARE_PROVIDER_SITE_OTHER): Payer: BLUE CROSS/BLUE SHIELD | Admitting: Emergency Medicine

## 2015-10-14 VITALS — BP 142/84 | HR 71 | Temp 98.3°F | Resp 16 | Ht 64.0 in | Wt 174.0 lb

## 2015-10-14 DIAGNOSIS — I10 Essential (primary) hypertension: Secondary | ICD-10-CM | POA: Diagnosis not present

## 2015-10-14 DIAGNOSIS — K219 Gastro-esophageal reflux disease without esophagitis: Secondary | ICD-10-CM

## 2015-10-14 DIAGNOSIS — I639 Cerebral infarction, unspecified: Secondary | ICD-10-CM | POA: Diagnosis not present

## 2015-10-14 MED ORDER — AMLODIPINE BESYLATE 5 MG PO TABS: 5.0000 mg | ORAL_TABLET | Freq: Every day | ORAL | Status: DC

## 2015-10-14 MED ORDER — RANITIDINE HCL 300 MG PO TABS: 300.0000 mg | ORAL_TABLET | ORAL | Status: DC

## 2015-10-14 NOTE — Progress Notes (Addendum)
By signing my name below, I, Raven Small, attest that this documentation has been prepared under the direction and in the presence of Arlyss Queen, MD.  Electronically Signed: Thea Alken, ED Scribe. 10/14/2015. 12:14 PM.  Chief Complaint:  Chief Complaint  Patient presents with  . Medication Problem    patient would like to try Amlodipine or something else for blood pressure     HPI: Megan Rivera is a 60 y.o. female who reports to [facility] today for a medication change. Pt states she had initially been on Amlodipine but requested a medication change after experiencing skin change while on this medication. Initial medication change was made by Dr. Nancy Fetter.  Pt would now like to switch back to Amlodipine. States Metoprolol causes extreme fatigue and found that skin changes were never caused by Amlodipine. .  Pt takes Zantac once a day but still has break through reflux symptoms and has to take Tums.     Pt is UTD on colonoscopy.   Pt reports hx of hemorrhagic stroke in 2014. She had 1 follow up visit with neuro but none after that. Pt states lately her SO has noticed her Gait has been off and also reports her "toes have been curling". Pt is not a smoker.   CT head 05/02/13  Abnormal CT head (without) demonstrating: 1. Right basal ganglia and right frontal subcortical region of hypodensity (2.4x1.0cm). May represent cystic encephalomalacia from prior intracerebral hemorrhage. 2. No acute findings.  Past Medical History  Diagnosis Date  . Hypertension   . Stroke [facility]) 2014   Past Surgical History  Procedure Laterality Date  . Hernia repair      as a small child  . Lump aspiration  1998   Social History   Social History  . Marital Status: Divorced    Spouse Name: N/A  . Number of Children: 1  . Years of Education: Bachelor   Social History Main Topics  . Smoking status: Never Smoker   . Smokeless tobacco: Never Used  . Alcohol Use: 4.2 oz/week    7 Glasses of wine per week   Comment: occasionally 3 drinks daily  . Drug Use: No  . Sexual Activity:    Partners: Male    Birth Control/ Protection: None   Other Topics Concern  . None   Social History Narrative   Patient lives at home with with boyfriend, has 1 child   Patient is right handed   Education level is Bachelor   Caffeine consumption is 1 cup daily   Family History  Problem Relation Age of Onset  . Hypertension Father   . Heart disease Father   . Crohn's disease Father   . Rectal cancer Father   . Cancer Maternal Aunt   . Cancer Paternal Uncle    No Known Allergies Prior to Admission medications   Medication Sig Start Date End Date Taking? Authorizing Provider  metoprolol succinate (TOPROL-XL) 25 MG 24 hr tablet Take 25 mg by mouth daily.   Yes Historical Provider, MD     ROS: The patient denies fevers, chills, night sweats, unintentional weight loss, chest pain, palpitations, wheezing, dyspnea on exertion, nausea, vomiting, abdominal pain, dysuria, hematuria, melena, numbness, weakness, or tingling.   All other systems have been reviewed and were otherwise negative with the exception of those mentioned in the HPI and as above.    PHYSICAL EXAM: Filed Vitals:   10/14/15 1127  BP: 142/84  Pulse: 71  Temp: 98.3 F (36.8 C)  Resp: 16  Recheck:  Left arm-162/100 Right arm -164/104   Body mass index is 29.85 kg/(m^2).   General: Alert, no acute distress HEENT:  Normocephalic, atraumatic, oropharynx patent. Eye: Juliette Mangle [facility] Cardiovascular:  Regular rate and rhythm, no rubs murmurs or gallops.  No Carotid bruits, radial pulse intact. No pedal edema.  Respiratory: Clear to auscultation bilaterally.  No wheezes, rales, or rhonchi.  No cyanosis, no use of accessory musculature Abdominal: No organomegaly, abdomen is soft and non-tender, positive bowel sounds.  No masses. Musculoskeletal: Gait intact. No edema, tenderness Skin: No rashes. Neurologic: Facial musculature symmetric.  Brisk reflexes but no focal neurological signs.  Psychiatric: Patient acts appropriately throughout our interaction. Lymphatic: No cervical or submandibular lymphadenopathy   ASSESSMENT/PLAN: Patient has a history of previous stroke. She has not had follow-up in over a year. She is referred for CT of the head as well as follow-up with neurology. She feels weak and fatigued on beta blockers. She is placed back on amlodipine. She will keep a register of her blood pressure and follow-up in 1-2 weeks. At that time will consider scheduling a sleep study.I personally performed the services described in this documentation, which was scribed in my presence. The recorded information has been reviewed and is accurate. She also requested medications to take for reflux. She had been on Prilosec in the past but weaned off of this and experienced extreme heartburn. She is now starting to experience reflux again and would like to try medication for. I prescribed Zantac 300 milligrams to take at night.   Gross sideeffects, risk and benefits, and alternatives of medications d/w patient. Patient is aware that all medications have potential sideeffects and we are unable to predict every sideeffect or drug-drug interaction that may occur.  Arlyss Queen MD 10/14/2015 12:14 PM

## 2015-10-14 NOTE — Patient Instructions (Addendum)
I have changed her blood pressure medicine to amlodipine. I will schedule you for a follow-up CT of your head. I have made a referral for you to see the neurologist.    IF you received an x-ray today, you will receive an invoice from [facility]. Please contact [facility] at [phone] with questions or concerns regarding your invoice.   IF you received labwork today, you will receive an invoice from [employer]. Please contact [facility] at [phone] with questions or concerns regarding your invoice.   Our billing staff will not be able to assist you with questions regarding bills from these companies.  You will be contacted with the lab results as soon as they are available. The fastest way to get your results is to activate your My Chart account. Instructions are located on the last page of this paperwork. If you have not heard from [place] regarding the results in 2 weeks, please contact this office.     We recommend that you schedule a mammogram for breast cancer screening. Typically, you do not need a referral to do this. Please contact a local imaging center to schedule your mammogram.  [facility] - [phone]  *ask for the Radiology Department [facility] - [phone] or [phone]  [facility] - [phone] [place] [phone] [facility] - [phone]  *ask for the [facility] - [phone]  *ask for the Radiology Department [facility] - [phone]  *ask for the [place] - [phone]

## 2015-10-22 ENCOUNTER — Ambulatory Visit
Admission: RE | Admit: 2015-10-22 | Discharge: 2015-10-22 | Disposition: A | Discharge status: Home / Self Care | Payer: BLUE CROSS/BLUE SHIELD | Source: Ambulatory Visit | Attending: Emergency Medicine | Admitting: Emergency Medicine

## 2015-10-22 DIAGNOSIS — I639 Cerebral infarction, unspecified: Secondary | ICD-10-CM

## 2016-07-29 ENCOUNTER — Ambulatory Visit: Payer: BLUE CROSS/BLUE SHIELD

## 2016-10-09 ENCOUNTER — Other Ambulatory Visit: Payer: Self-pay | Admitting: Emergency Medicine

## 2016-10-10 NOTE — Telephone Encounter (Signed)
Please call patient. Rx authorized. Needs to schedule follow-up and establish with new PCP since Dr. Laney Pastor has retired.  Meds ordered this encounter  Medications  . amLODipine (NORVASC) 5 MG tablet    Sig: TAKE 1 TABLET BY MOUTH DAILY    Dispense:  90 tablet    Refill:  0    Please notify patient that s/he needs an office visit +/- labsfor additional refills.

## 2016-10-11 ENCOUNTER — Telehealth: Payer: Self-pay | Admitting: Family Medicine

## 2016-10-11 NOTE — Telephone Encounter (Signed)
SPOKE WITH PT SHE STATES THAT SHE WILL CALL BACK FOR ESTABLISH CARE WITH A NEW PROVIDER IF SHE FEEL LIKE SHE NEW A REFILL ON MEDICINES

## 2016-11-17 ENCOUNTER — Other Ambulatory Visit: Payer: Self-pay | Admitting: Emergency Medicine

## 2016-12-20 ENCOUNTER — Ambulatory Visit: Payer: BLUE CROSS/BLUE SHIELD | Admitting: Physician Assistant

## 2016-12-21 ENCOUNTER — Ambulatory Visit (INDEPENDENT_AMBULATORY_CARE_PROVIDER_SITE_OTHER): Payer: BLUE CROSS/BLUE SHIELD | Admitting: Physician Assistant

## 2016-12-21 ENCOUNTER — Encounter: Payer: Self-pay | Admitting: Physician Assistant

## 2016-12-21 VITALS — BP 132/94 | HR 73 | Temp 97.8°F | Resp 18 | Ht 64.0 in | Wt 175.8 lb

## 2016-12-21 DIAGNOSIS — I10 Essential (primary) hypertension: Secondary | ICD-10-CM

## 2016-12-21 DIAGNOSIS — K219 Gastro-esophageal reflux disease without esophagitis: Secondary | ICD-10-CM

## 2016-12-21 DIAGNOSIS — L03818 Cellulitis of other sites: Secondary | ICD-10-CM

## 2016-12-21 DIAGNOSIS — Z23 Encounter for immunization: Secondary | ICD-10-CM | POA: Diagnosis not present

## 2016-12-21 MED ORDER — AMLODIPINE BESYLATE 5 MG PO TABS: 5.0000 mg | ORAL_TABLET | Freq: Every day | ORAL | 1 refills | Status: DC

## 2016-12-21 MED ORDER — DOXYCYCLINE HYCLATE 100 MG PO CAPS: 100.0000 mg | ORAL_CAPSULE | Freq: Two times a day (BID) | ORAL | 0 refills | Status: AC

## 2016-12-21 MED ORDER — RANITIDINE HCL 300 MG PO TABS: 300.0000 mg | ORAL_TABLET | ORAL | 1 refills | Status: DC

## 2016-12-21 NOTE — Patient Instructions (Addendum)
In terms of elevated blood pressure, I would like you to check your blood pressure at least a couple times over the next week outside of the office and document these values. It is best if you check the blood pressure at different times in the day. Your goal is <140/90. If your values are consistently above this goal, please increase amlodipine dose to 10mg  daily. Otherwise, if it is <140/90, keep taking the amlodipine 5mg . If you start to have chest pain, blurred vision, shortness of breath, severe headache, lower leg swelling, or nausea/vomiting please seek care immediately here or at the ED.   In terms of cellulitis, take antibiotic as prescribed. Use sunscreen while on this medication as it can increase risk of sunburn while on it. I also recommend applying a warm compress to affected area for 4-5 x a day. If your symptoms worsen or this does not fully resolve, please return for further evaluation.    IF you received an x-ray today, you will receive an invoice from [facility]. Please contact [facility] at [phone] with questions or concerns regarding your invoice.   IF you received labwork today, you will receive an invoice from [place]. Please contact [facility] at [phone] with questions or concerns regarding your invoice.   Our billing staff will not be able to assist you with questions regarding bills from these companies.  You will be contacted with the lab results as soon as they are available. The fastest way to get your results is to activate your My Chart account. Instructions are located on the last page of this paperwork. If you have not heard from [place] regarding the results in 2 weeks, please contact this office.

## 2016-12-21 NOTE — Progress Notes (Signed)
Megan Rivera  MRN: [id number] DOB: July 18, 1955  Subjective:  Megan Rivera is a 61 y.o. female seen in office today for a chief complaint of multiple complaints. Patient states she is moving to the [facility] and needs medication refills for at least 6 months until she can establish with a PCP there.   1) Lump in groin: Has noticed a lump in groin x 1 week. Has associated tenderness. Denies fever, chills, redness, warmth, purulent drainage. Has not tried anything for relief. Notes it has gotten significantly better over the past week.  2) HTN: Has had dx since 2014. Currently managed with amlodipine 5mg . Patient is not checking blood pressure at home. Reports no symptoms. Denies lightheadedness, dizziness, chronic headache, double vision, chest pain, shortness of breath, heart racing, palpitations, nausea, vomiting, abdominal pain, hematuria, lower leg swelling. Denies smoking. Drinks 6 beers a week.    3) GERD: Has had dx of for 2 years. Takes zantac 300mg  daily and this controls her symptoms.  Digital 07/14/2003.  Review of Systems  Per HPI  Patient Active Problem List   Diagnosis Date Noted  . HTN (hypertension) 02/19/2013  . CVA (cerebral infarction) 02/19/2013  . Preventative health care 02/19/2013  . GERD (gastroesophageal reflux disease) 02/19/2013    Current Outpatient Prescriptions on File Prior to Visit  Medication Sig Dispense Refill  . amLODipine (NORVASC) 5 MG tablet TAKE 1 TABLET BY MOUTH DAILY 90 tablet 0  . ranitidine (ZANTAC) 300 MG tablet Take 1 tablet (300 mg total) by mouth 1 day or 1 dose. 30 tablet 11   No current facility-administered medications on file prior to visit.     No Known Allergies    Social History   Social History  . Marital status: Divorced    Spouse name: N/A  . Number of children: 1  . Years of education: Bachelor   Occupational History  . Not on file.   Social History Main Topics  . Smoking status: Never Smoker  . Smokeless  tobacco: Never Used  . Alcohol use 4.2 oz/week    7 Glasses of wine per week     Comment: occasionally 3 drinks daily  . Drug use: No  . Sexual activity: Yes    Partners: Male    Birth control/ protection: None   Other Topics Concern  . Not on file   Social History Narrative   Patient lives at home with with boyfriend, has 1 child   Patient is right handed   Education level is Bachelor   Caffeine consumption is 1 cup daily    Objective:  BP (!) 132/94   Pulse 73   Temp 97.8 F (36.6 C) (Oral)   Resp 18   Ht 5\' 4"  (1.626 m)   Wt 175 lb 12.8 oz (79.7 kg)   LMP 07/03/2012   SpO2 95%   BMI 30.18 kg/m   Physical Exam  Constitutional: She is oriented to person, place, and time and well-developed, well-nourished, and in no distress.  HENT:  Head: Normocephalic and atraumatic.  Mouth/Throat: Uvula is midline, oropharynx is clear and moist and mucous membranes are normal.  Eyes: Pupils are equal, round, and reactive to light. Conjunctivae and EOM are normal.  Neck: Normal range of motion.  Cardiovascular: Normal rate, regular rhythm and normal heart sounds.   Pulmonary/Chest: Effort normal. She has no wheezes. She has no rhonchi. She has no rales.  Genitourinary:  Genitourinary Comments: ~2cm induration on left labia majora. Mild overlying erythema or  warmth noted.  No fluctuance noted. No purulent discharge noted. Minimal tenderness to palpation.  Musculoskeletal:       Right lower leg: She exhibits no swelling.       Left lower leg: She exhibits no swelling.  Neurological: She is alert and oriented to person, place, and time. Gait normal.  Skin: Skin is warm and dry.  Psychiatric: Affect normal.  Vitals reviewed.  Assessment and Plan :  1. Essential hypertension Initially uncontrolled in office; however, repeat bp was 132/94. Patient highly encouraged to check blood pressure outside of office. Instructed to take amlodipine 10 mg daily if bp values are consistently >  140/90. Otherwise, if bp remains < 140/90, continue amlodipine 5 mg daily.  Do not take more than amlodipine 10 mg daily without further evaluation in office. Given strict ED precautions. Highly encouraged to return to office if BP remains > 140/90. - amLODipine (NORVASC) 5 MG tablet; Take 1 tablet (5 mg total) by mouth daily.  Dispense: 90 tablet; Refill: 1  2. Cellulitis of other specified  No fluctuance noted on exam; therefore, I&D not warranted. Reassuring that it has significantly improved since it appeared one week ago. Given Rx for doxycycline. Instructed to use warm compress to affected area 4-5 times a day. Return to clinic if symptoms worsen or do not start to resolve in 2-3 days. - doxycycline (VIBRAMYCIN) 100 MG capsule; Take 1 capsule (100 mg total) by mouth 2 (two) times daily.  Dispense: 20 capsule; Refill: 0  3. Gastroesophageal reflux disease, esophagitis presence not specified - ranitidine (ZANTAC) 300 MG tablet; Take 1 tablet (300 mg total) by mouth 1 day or 1 dose.  Dispense: 90 tablet; Refill: 1  4. Need for influenza vaccination - Flu Vaccine QUAD 6+ mos PF IM (Fluarix Quad PF)  Tenna Delaine PA-C  [facility] [place] 12/21/2016 12:53 PM

## 2016-12-22 LAB — CMP14+EGFR
A/G RATIO: 1.5 (ref 1.2–2.2)
ALT: 39 IU/L — ABNORMAL HIGH (ref 0–32)
AST: 31 IU/L (ref 0–40)
Albumin: 4.3 g/dL (ref 3.5–5.5)
Alkaline Phosphatase: 107 IU/L (ref 39–117)
BUN/Creatinine Ratio: 17 (ref 9–23)
BUN: 15 mg/dL (ref 6–24)
Bilirubin Total: 0.2 mg/dL (ref 0.0–1.2)
CO2: 20 mmol/L (ref 20–29)
Calcium: 9.6 mg/dL (ref 8.7–10.2)
Chloride: 105 mmol/L (ref 96–106)
Creatinine, Ser: 0.88 mg/dL (ref 0.57–1.00)
GFR, EST AFRICAN AMERICAN: 83 mL/min/{1.73_m2} (ref >59)
GFR, EST NON AFRICAN AMERICAN: 72 mL/min/{1.73_m2} (ref >59)
GLOBULIN, TOTAL: 2.8 g/dL (ref 1.5–4.5)
Glucose: 105 mg/dL — ABNORMAL HIGH (ref 65–99)
POTASSIUM: 4.3 mmol/L (ref 3.5–5.2)
SODIUM: 143 mmol/L (ref 134–144)
Total Protein: 7.1 g/dL (ref 6.0–8.5)

## 2016-12-25 IMAGING — CT CT HEAD W/O CM
3 series · 16 of 45 positions shown, 19 images · non-contrast
Comparison: 05/02/2013

CLINICAL DATA: Followup from a previous stroke. No injury. No
history of surgery or carcinoma.

EXAM:
CT HEAD WITHOUT CONTRAST
TECHNIQUE: Contiguous axial images were obtained from the base of the skull
through the vertex without intravenous contrast.

[Series 2: head w/(date) · axial · 0.41mm/px · z∈[+1093,+1208]mm · 10 of 28 slices shown, 13 images]
[im 3/28  brain]
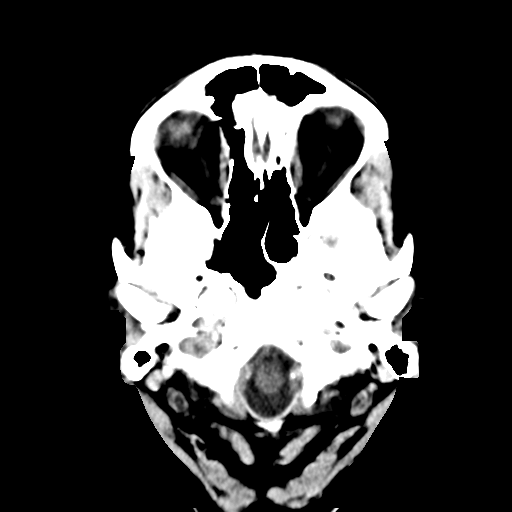
[im 3/28  bone]
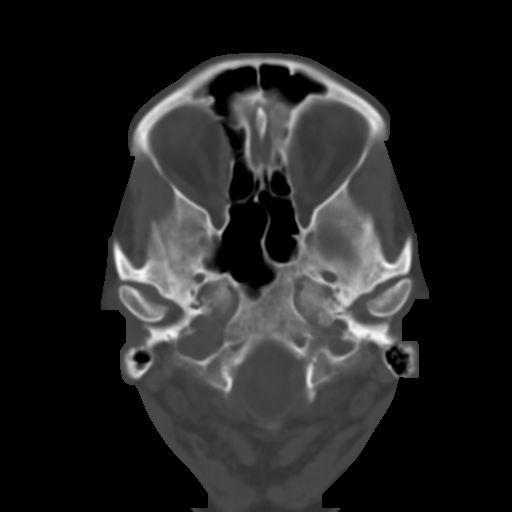
[im 5/28  brain]
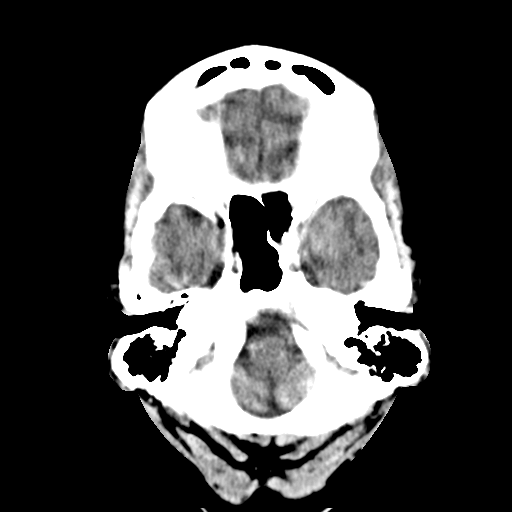
[im 8/28  brain]
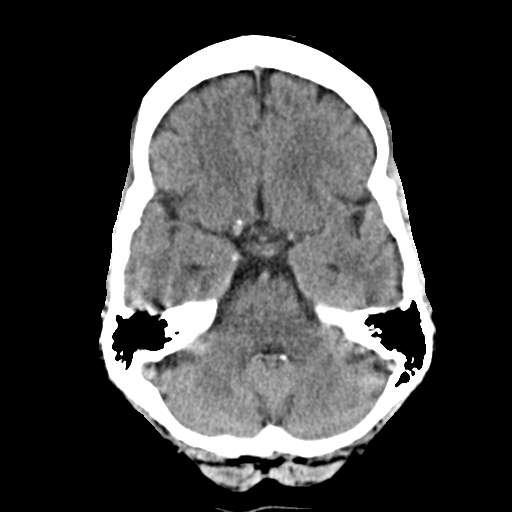
[im 11/28  brain]
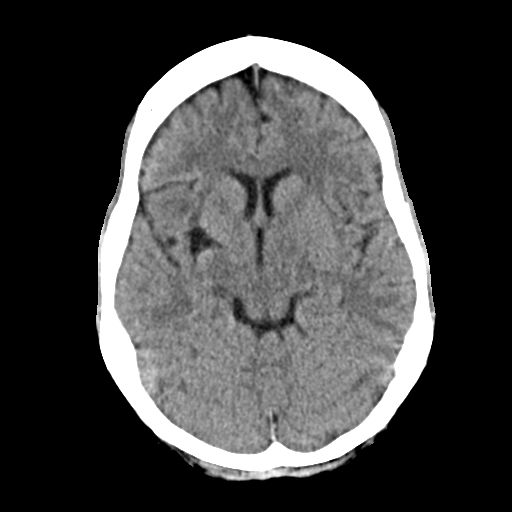
[im 13/28  brain]
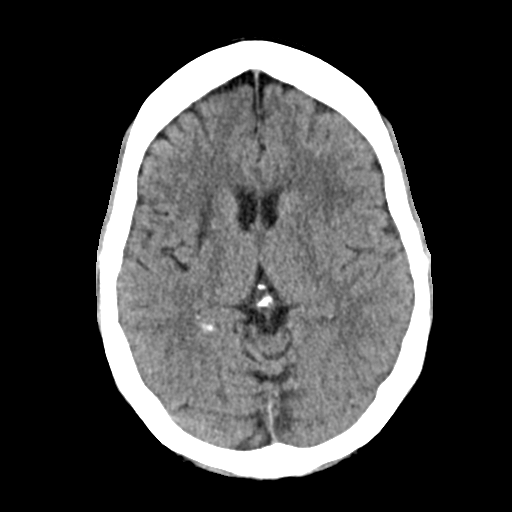
[im 13/28  bone]
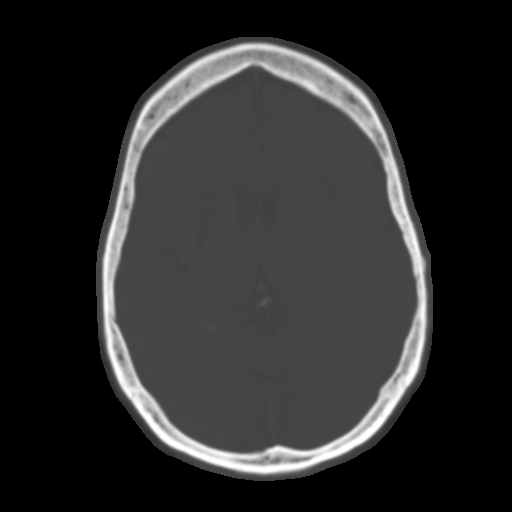
[im 16/28  brain]
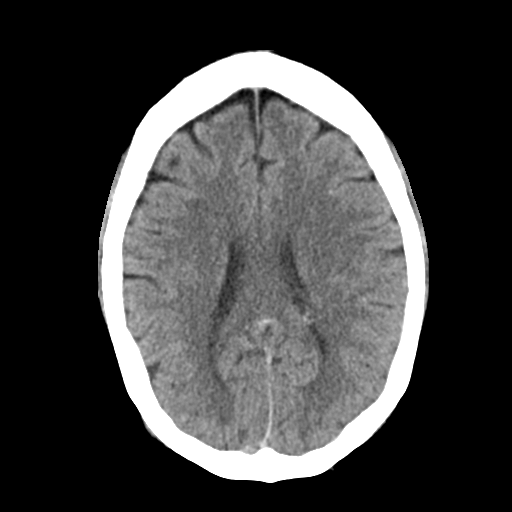
[im 18/28  brain]
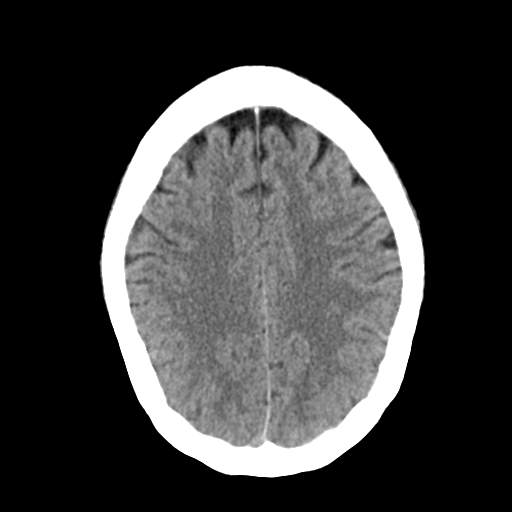
[im 21/28  brain]
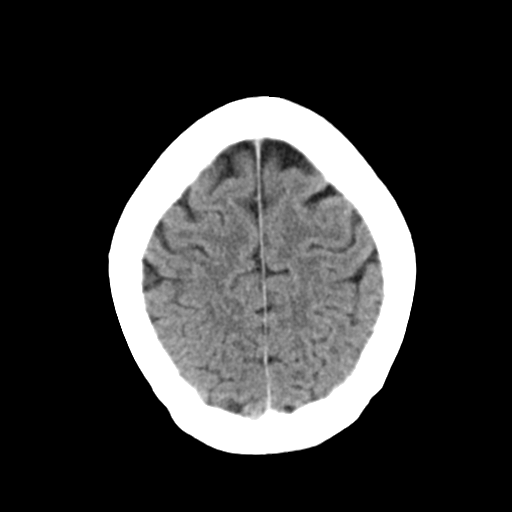
[im 24/28  brain]
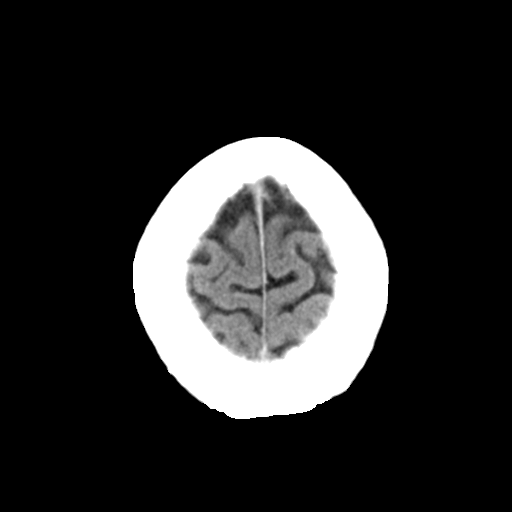
[im 24/28  bone]
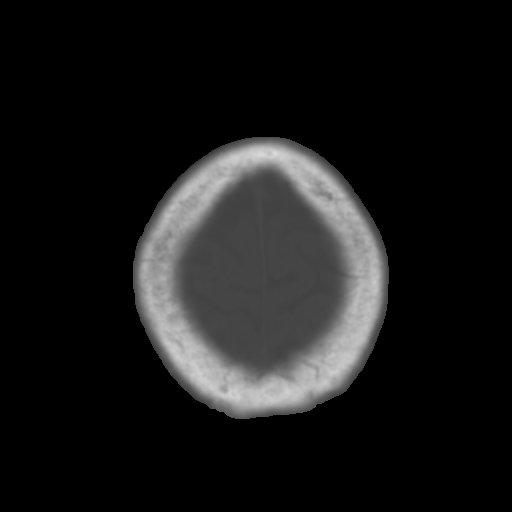
[im 26/28  brain]
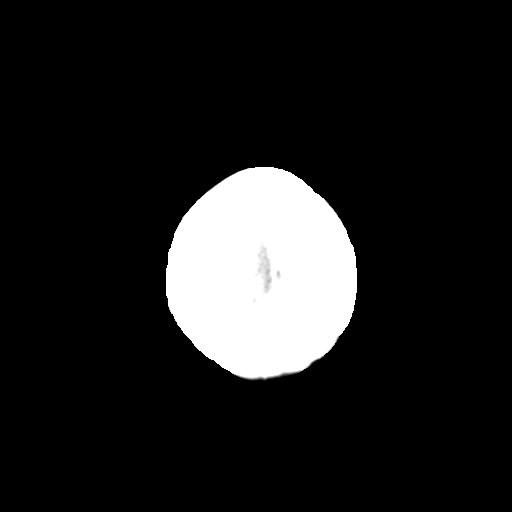

[Series 4: cor · coronal · 0.28mm/px · 3 of 62 slices shown]
[im 21/62  brain]
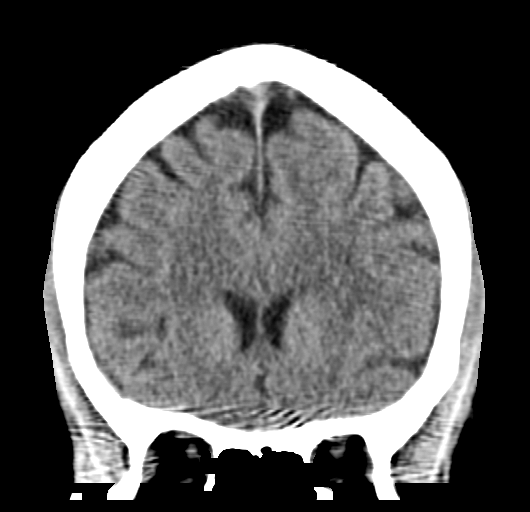
[im 28/62  brain]
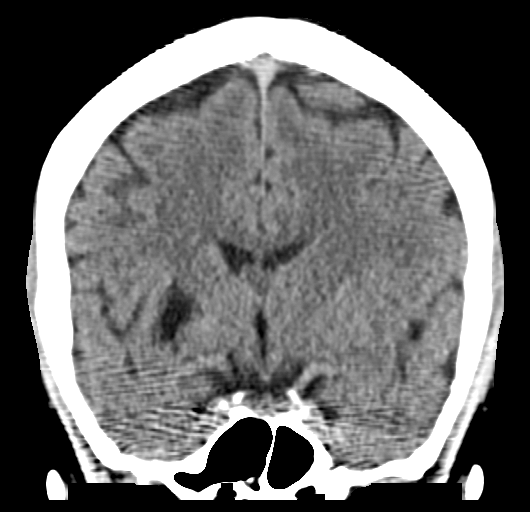
[im 34/62  brain]
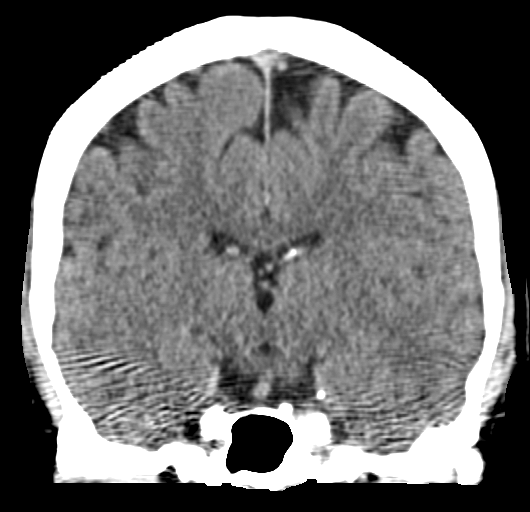

[Series 5: sag · sagittal · 0.29mm/px · 3 of 48 slices shown]
[im 16/48  brain]
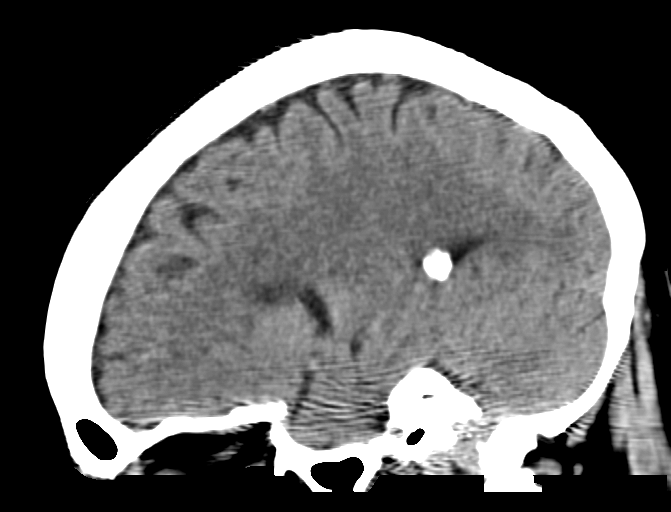
[im 24/48  brain]
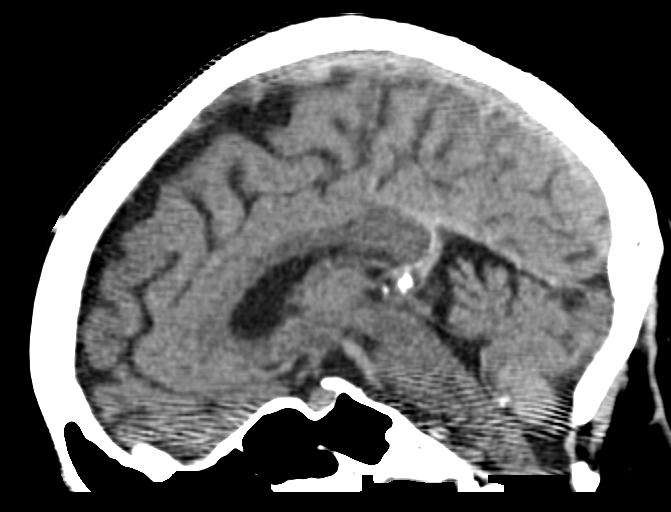
[im 32/48  brain]
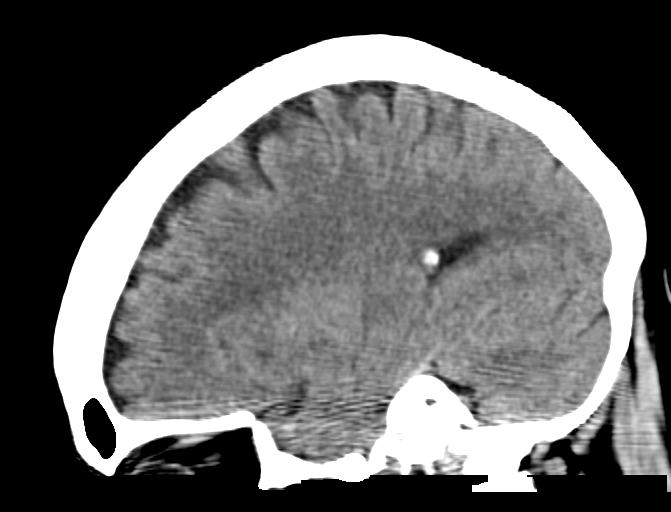

[16 of 45 positions shown; findings below may reference images not displayed]

FINDINGS: The ventricles are normal in size and configuration.

There are no parenchymal masses or mass effect.

There is well defined hypoattenuation extending from the right base
a ganglia to the right deep frontal lobe white matter, stable from
the prior CT consistent with an old infarct. There is no evidence of
a recent cortical infarct.

There are no extra-axial masses or abnormal fluid collections.

There is no intracranial hemorrhage.

The visualized sinuses and mastoid air cells are clear. No skull
lesion.
IMPRESSION: 1. No acute findings.
2. Stable right middle cerebral artery distribution infarct
involving the right base a ganglia and adjacent deep right frontal
lobe white matter. No new infarct.

## 2017-07-06 ENCOUNTER — Other Ambulatory Visit: Payer: Self-pay

## 2017-07-06 DIAGNOSIS — K219 Gastro-esophageal reflux disease without esophagitis: Secondary | ICD-10-CM

## 2017-07-06 DIAGNOSIS — I10 Essential (primary) hypertension: Secondary | ICD-10-CM

## 2017-07-06 MED ORDER — RANITIDINE HCL 300 MG PO TABS: 300.0000 mg | ORAL_TABLET | ORAL | 0 refills | Status: AC

## 2017-07-06 MED ORDER — AMLODIPINE BESYLATE 5 MG PO TABS: 5.0000 mg | ORAL_TABLET | Freq: Every day | ORAL | 0 refills | Status: AC

## 2017-07-07 ENCOUNTER — Other Ambulatory Visit: Payer: Self-pay | Admitting: Physician Assistant

## 2017-07-07 DIAGNOSIS — I10 Essential (primary) hypertension: Secondary | ICD-10-CM

## 2017-07-23 ENCOUNTER — Other Ambulatory Visit: Payer: Self-pay | Admitting: Physician Assistant

## 2017-07-23 DIAGNOSIS — I10 Essential (primary) hypertension: Secondary | ICD-10-CM

## 2017-07-24 NOTE — Telephone Encounter (Signed)
Reached  Patient  By  Telephone   She  States  She is  Now   Living in [facility]   -  She  States  She  Will  Be getting a  PCP  In [place]  And  She  Still  Has  Several  Pills  Left

## 2017-07-24 NOTE — Telephone Encounter (Signed)
Message  Left  On  Patients  VM   That she  Needs  To call back and  Make  An Appointment . So that  She  May  Be  Given a   15  Day  Refill   On her  Amlodipine.She   Has  Already  Been given   A  30  Day  Supply.

## 2017-07-29 ENCOUNTER — Other Ambulatory Visit: Payer: Self-pay | Admitting: Physician Assistant

## 2017-07-29 DIAGNOSIS — I10 Essential (primary) hypertension: Secondary | ICD-10-CM
